# Patient Record
Sex: Male | Born: 1945 | Race: Black or African American | Hispanic: No | Marital: Married | Smoking: Never smoker
Health system: Southern US, Community
[De-identification: ages and names within clinical notes are randomized; demographics above are authoritative.]

## PROBLEM LIST (undated history)

## (undated) DIAGNOSIS — E119 Type 2 diabetes mellitus without complications: Secondary | ICD-10-CM

## (undated) DIAGNOSIS — I10 Essential (primary) hypertension: Secondary | ICD-10-CM

## (undated) DIAGNOSIS — E7800 Pure hypercholesterolemia, unspecified: Secondary | ICD-10-CM

## (undated) HISTORY — PX: ANKLE SURGERY: SHX546

## (undated) HISTORY — PX: NOSE SURGERY: SHX723

## (undated) HISTORY — PX: ELBOW SURGERY: SHX618

## (undated) HISTORY — PX: HERNIA REPAIR: SHX51

## (undated) HISTORY — PX: TONSILLECTOMY: SUR1361

## (undated) HISTORY — PX: HAND SURGERY: SHX662

---

## 2003-03-16 ENCOUNTER — Other Ambulatory Visit: Payer: Self-pay

## 2004-09-24 ENCOUNTER — Emergency Department: Payer: Self-pay | Admitting: Internal Medicine

## 2004-09-24 ENCOUNTER — Other Ambulatory Visit: Payer: Self-pay

## 2005-05-07 ENCOUNTER — Emergency Department: Payer: Self-pay | Admitting: Emergency Medicine

## 2006-03-11 ENCOUNTER — Emergency Department: Payer: Self-pay | Admitting: Internal Medicine

## 2010-10-04 ENCOUNTER — Emergency Department: Payer: Self-pay | Admitting: Emergency Medicine

## 2010-10-06 DIAGNOSIS — E113299 Type 2 diabetes mellitus with mild nonproliferative diabetic retinopathy without macular edema, unspecified eye: Secondary | ICD-10-CM | POA: Insufficient documentation

## 2010-10-06 DIAGNOSIS — E119 Type 2 diabetes mellitus without complications: Secondary | ICD-10-CM | POA: Insufficient documentation

## 2017-10-05 DIAGNOSIS — M1710 Unilateral primary osteoarthritis, unspecified knee: Secondary | ICD-10-CM | POA: Insufficient documentation

## 2017-10-05 DIAGNOSIS — M179 Osteoarthritis of knee, unspecified: Secondary | ICD-10-CM | POA: Insufficient documentation

## 2018-05-04 ENCOUNTER — Emergency Department
Admission: EM | Admit: 2018-05-04 | Discharge: 2018-05-04 | Disposition: A | Discharge status: Home / Self Care | Payer: Medicare Other | Attending: Student in an Organized Health Care Education/Training Program | Admitting: Student in an Organized Health Care Education/Training Program

## 2018-05-04 ENCOUNTER — Other Ambulatory Visit: Payer: Self-pay

## 2018-05-04 ENCOUNTER — Encounter: Payer: Self-pay | Admitting: Emergency Medicine

## 2018-05-04 ENCOUNTER — Emergency Department: Payer: Medicare Other

## 2018-05-04 DIAGNOSIS — R0602 Shortness of breath: Secondary | ICD-10-CM | POA: Insufficient documentation

## 2018-05-04 DIAGNOSIS — I10 Essential (primary) hypertension: Secondary | ICD-10-CM | POA: Diagnosis not present

## 2018-05-04 DIAGNOSIS — E119 Type 2 diabetes mellitus without complications: Secondary | ICD-10-CM | POA: Diagnosis not present

## 2018-05-04 DIAGNOSIS — J40 Bronchitis, not specified as acute or chronic: Secondary | ICD-10-CM | POA: Diagnosis not present

## 2018-05-04 DIAGNOSIS — R0789 Other chest pain: Secondary | ICD-10-CM | POA: Diagnosis present

## 2018-05-04 HISTORY — DX: Type 2 diabetes mellitus without complications: E11.9

## 2018-05-04 HISTORY — DX: Pure hypercholesterolemia, unspecified: E78.00

## 2018-05-04 HISTORY — DX: Essential (primary) hypertension: I10

## 2018-05-04 LAB — CBC
HEMATOCRIT: 44 % (ref 39.0–52.0)
Hemoglobin: 15 g/dL (ref 13.0–17.0)
MCH: 29.8 pg (ref 26.0–34.0)
MCHC: 34.1 g/dL (ref 30.0–36.0)
MCV: 87.5 fL (ref 80.0–100.0)
Platelets: 225 10*3/uL (ref 150–400)
RBC: 5.03 MIL/uL (ref 4.22–5.81)
RDW: 13.4 % (ref 11.5–15.5)
WBC: 4.5 10*3/uL (ref 4.0–10.5)
nRBC: 0 % (ref 0.0–0.2)

## 2018-05-04 LAB — BASIC METABOLIC PANEL
Anion gap: 11 (ref 5–15)
BUN: 16 mg/dL (ref 8–23)
CO2: 26 mmol/L (ref 22–32)
Calcium: 9.8 mg/dL (ref 8.9–10.3)
Chloride: 103 mmol/L (ref 98–111)
Creatinine, Ser: 1.09 mg/dL (ref 0.61–1.24)
GFR calc Af Amer: >60 mL/min (ref >60)
GFR calc non Af Amer: >60 mL/min (ref >60)
Glucose, Bld: 132 mg/dL — ABNORMAL HIGH (ref 70–99)
Potassium: 3.3 mmol/L — ABNORMAL LOW (ref 3.5–5.1)
Sodium: 140 mmol/L (ref 135–145)

## 2018-05-04 LAB — TROPONIN I
Troponin I: <0.03 ng/mL (ref <0.03)
Troponin I: <0.03 ng/mL (ref <0.03)

## 2018-05-04 MED ORDER — ALBUTEROL SULFATE HFA 108 (90 BASE) MCG/ACT IN AERS: 2.0000 | INHALATION_SPRAY | Freq: Four times a day (QID) | RESPIRATORY_TRACT | 2 refills | Status: AC | PRN

## 2018-05-04 MED ORDER — IPRATROPIUM-ALBUTEROL 0.5-2.5 (3) MG/3ML IN SOLN
3.0000 mL | Freq: Once | RESPIRATORY_TRACT | Status: AC
  Administered 2018-05-04: 3 mL via RESPIRATORY_TRACT
  Filled 2018-05-04: qty 3

## 2018-05-04 NOTE — ED Triage Notes (Signed)
Patient reports chest tightness since Monday.  Patient states he has had this same feeling before and was given an inhaler and felt better.  Patient denies cough and shortness of breath.  Patient is in no obvious distress at this time.  Patient denies cardiac history.

## 2018-05-04 NOTE — Discharge Instructions (Addendum)
You have a viral illness which can have symptoms like muscle aches, fevers, chills, runny nose, cough, sneezing, sore throat, vomiting or diarrhea. One of the viruses that can cause this is SARS- CoV-2, the virus that causes COVID-19, also known as the novel coronavirus. You could also have a different viral infection such as the common cold or flu. Most patients with viral illness including COVID-19 have mild symptoms and recover on their own. Resting, staying hydrated, and sleeping are helpful. Today we think you are well enough to go home and treat your symptoms with oral liquids, and medicine for fevers, cough, and pain. ° °We generally do not do COVID-19 testing on people with mild symptoms who are being discharged from the Emergency Department or Clinic.  ° °If we did a test for COVID-19 the results will not be available for several days. We will call you with the result. Please DO NOT CONTACT THE EMERGENCY DEPARTMENT OR CLINIC FOR RESULTS OF THIS TEST.  ° °Please follow the precautions below:  °Stay home for at least 7 days after your symptoms began OR for 3 days after your fever ends, whichever takes longer. ? ° °If people live with you they should also stay home and avoid contact with others for 14 days.? ° ° °ISOLATION GUIDANCE FOR POSSIBLE COVID °Most people with cough and fever have an illness caused by a virus. One is COVID-19. Not all people with these infections are being tested for the virus that causes COVID-19. People who might have COVID but are not being tested should still try to prevent the spread of the infection. ° °These instructions are modified recommendations from the [place] Department of Health and Human Services. ° °People who might have COVID-19 should follow the instructions below until a doctor or health department says they can stop. ° °Stay home unless you need to see a doctor °Stop doing things outside your home except for getting medical care. Do not go to work, school,  or public areas; and do not use public transportation or taxis. ° °Call ahead before visiting the doctor °Before your appointment, call the doctor's office and tell them about your symptoms. This will help them take steps to keep other people from getting infected.  ° °Keep track of your symptoms °Symptoms are the things you feel, like fever or trouble breathing. Go to your doctor or the ER if you think you are getting worse, like having more trouble breathing. Call the doctor's office and tell them about your symptoms. This will help them take steps to keep other people from getting sick.  ° °Wear a face mask °You should wear a face mask that covers your nose and mouth when you are in the same room with other people and when you visit the doctor's office. People who live with you or visit you should also wear a face mask when they are in the same room with you. ° °Separate yourself from other people in your home °You should stay in a different room from other people in your home. You should stay separate from your family members as much as possible. You should use a separate bathroom if you can. ° °Avoid sharing things in your house °Don't share dishes, drinking glasses, cups, eating utensils, towels, bedding, or other things with people in your home. After using these things please wash them really well with soap and water. ° °Cover your coughs and sneezes °Cover your mouth and nose with a tissue when you   cough or sneeze, or you can cough or sneeze into your sleeve. Throw used tissues in a trash can that has a bag in it, and immediately wash your hands with soap and water for at least 20 seconds. If you use an alcohol-based hand rub please rub your hands together for 20 seconds. ° °Wash your hands °Wash your hands often and very well with soap and water for at least 20 seconds. If your hands are not visibly dirty you can use an alcohol-based hand sanitizer. Don't touch your eyes, nose, or mouth with unwashed  hands. ° ° ° °Instructions for People Helping Care for Patients with Possible COVID-19 °Follow your doctor's instructions °Make sure that you understand and can help the patient follow any instructions for care. ° °Provide for the patient's basic needs °You should help the patient with basic needs in the home and provide support for getting groceries, prescriptions, and other personal needs. ° °Keep track of the patient's symptoms °If they are getting sicker, call his or her doctor. This will help the doctor's office take steps to keep other people from getting infected. ° °Limit the number of people who have contact with the patient °If possible, have only one caregiver for the patient. °Other family members should stay in another home or place of residence. If they can't, they should stay in another room and stay separated from the patient as much as possible. °Keep one bathroom JUST for the patient if you can.  °Only allow visitors if they MUST be in the home. ° °Keep older adults, very young children, and other sick people away from the patient °Keep older adults, very young children, and people who have compromised immune systems or chronic health conditions away from the patient. This includes people with chronic heart, lung, or kidney conditions, diabetes, and cancer. ° °Wash your hands often °Avoid touching your eyes, nose, and mouth with unwashed hands. °Wash your hands often and thoroughly with soap and water for at least 20 seconds. You can use an alcohol-based hand sanitizer if soap and water are not available and if your hands are not visibly dirty.  °Use disposable paper towels to dry your hands. If not available, use dedicated cloth towels and replace them when they become wet. ° °Avoid contamination from face masks and gloves °Wear a disposable face mask and gloves whenever you are touching the patient, things in their room or bathroom, or things that can have their body fluid on them, like bedding  or dishes, or blood, vomit, urine, or feces (poop).  °Ensure the mask fits over your nose and mouth tightly, and do not touch it at all while you are wearing it. °Throw out disposable facemasks and gloves after using them. Do not reuse. °Wash your hands immediately after removing your facemask and gloves. °If your personal clothing becomes dirty with a patient's body fluids, carefully remove clothing and launder. Wash your hands after handling dirty clothing. °Place all used disposable facemasks, gloves, and other waste in a lined container before disposing them with other household garbage.  °Remove gloves and wash your hands immediately after handling these items. ° °Do not share dishes, glasses, or other household items with the patient °Avoid sharing household items. You should not share dishes, drinking glasses, cups, eating utensils, towels, bedding, or other items with a patient who is confirmed to have, or being evaluated for, COVID-19 infection.  °After the person uses these items, you should wash them very well with soap and   water. ° °Wash laundry thoroughly °Immediately remove and wash clothes or bedding that have blood, body fluids, and/or secretions or excretions, such as sweat, saliva, sputum, nasal mucus, vomit, urine, or feces, on them.  °Wear gloves when handling laundry from the patient.  °Read and follow directions on labels of laundry or clothing items and detergent. In general, wash and dry with the warmest temperatures recommended on the label. ° °Clean all areas the individual has used  °Clean all touchable surfaces, such as counters, tabletops, doorknobs, bathroom fixtures, toilets, phones, keyboards, tablets, and bedside tables, every day. Also, clean any surfaces that may have blood, body fluids, and/or secretions or excretions on them. Wear gloves when cleaning surfaces the patient has come in contact with.  °Use a diluted bleach solution (dilute bleach with 1 part bleach and 10 parts  water) or a household disinfectant with a label that says EPA-registered for coronaviruses. To make a bleach solution at home, add 1 tablespoon of bleach to 1 quart (4 cups) of water. For a larger supply, add ¼ cup of bleach to 1 gallon (16 cups) of water.  °Read labels of cleaning products and follow recommendations provided on product labels. Labels contain instructions for safe and effective use of the cleaning product including precautions you should take when applying the product, such as wearing gloves or eye protection and making sure you have good ventilation during use of the product.  °Remove gloves and wash hands immediately after cleaning. ° °Monitor yourself for signs and symptoms of illness °Caregivers and household members are considered close contacts, should monitor their health, and will be asked to limit movement outside of the home as much as possible.  ° °If you have additional questions °Contact  °Your Doctor or Healthcare Provider °The [facility] website (http://www.[facility].edu/coronavirus) °The [facility] COVID hotline 336-70COVID °Your local health department  ° °This guidance is subject to change. For the most up to date guidance, check the state Department of Health website at [facility]CD[facility].GOV ° °

## 2018-05-04 NOTE — ED Notes (Signed)
AAOx3.  Skin warm and dry.  NAD 

## 2018-05-04 NOTE — ED Provider Notes (Signed)
[facility] Emergency Department Provider Note    First MD Initiated Contact with Patient 05/04/18 1522     (approximate)  I have reviewed the triage vital signs and the nursing notes.   HISTORY  Chief Complaint Chest Pain    HPI Aaron Reynolds is a 73 y.o. male presents the ER for roughly 1 day of chest tightness.  States he actually started feeling some shortness of breath and discomfort 3 days ago.  Started having worsening chest pains today.  Does have a history of diabetes as well as high blood pressure.  Denies any history of heart disease.  States he has had inhaler treatments in the past with improvement in symptoms.  He denies any fevers at home and has been checking his temperature daily.    Past Medical History:  Diagnosis Date  . Diabetes mellitus without complication (HCC)   . High cholesterol   . Hypertension    No family history on file.  There are no active problems to display for this patient.     Prior to Admission medications   Medication Sig Start Date End Date Taking? Authorizing Provider  albuterol (PROVENTIL HFA;VENTOLIN HFA) 108 (90 Base) MCG/ACT inhaler Inhale 2 puffs into the lungs every 6 (six) hours as needed for wheezing or shortness of breath. 05/04/18   Willy Eddy, MD    Allergies Lisinopril and Celecoxib    Social History Social History   Tobacco Use  . Smoking status: Never Smoker  . Smokeless tobacco: Never Used  Substance Use Topics  . Alcohol use: Never    Frequency: Never  . Drug use: Not on file    Review of Systems Patient denies headaches, rhinorrhea, blurry vision, numbness, shortness of breath, chest pain, edema, cough, abdominal pain, nausea, vomiting, diarrhea, dysuria, fevers, rashes or hallucinations unless otherwise stated above in HPI. ____________________________________________   PHYSICAL EXAM:  VITAL SIGNS: Vitals:   05/04/18 1600 05/04/18 1601  BP: 124/73 124/73   Pulse:  81  Resp: 18 14  Temp:  97.6 F (36.4 C)  SpO2:  100%    Constitutional: Alert and oriented.  Eyes: Conjunctivae are normal.  Head: Atraumatic. Nose: No congestion/rhinnorhea. Mouth/Throat: Mucous membranes are moist.   Neck: No stridor. Painless ROM.  Cardiovascular: Normal rate, regular rhythm. Grossly normal heart sounds.  Good peripheral circulation. Respiratory: Normal respiratory effort.  No retractions. Lungs CTAB. Gastrointestinal: Soft and nontender. No distention. No abdominal bruits. No CVA tenderness. Genitourinary:  Musculoskeletal: No lower extremity tenderness nor edema.  No joint effusions. Neurologic:  Normal speech and language. No gross focal neurologic deficits are appreciated. No facial droop Skin:  Skin is warm, dry and intact. No rash noted. Psychiatric: Mood and affect are normal. Speech and behavior are normal.  ____________________________________________   LABS (all labs ordered are listed, but only abnormal results are displayed)  Results for orders placed or performed during the hospital encounter of 05/04/18 (from the past 24 hour(s))  Basic metabolic panel     Status: Abnormal   Collection Time: 05/04/18  1:25 PM  Result Value Ref Range   Sodium 140 135 - 145 mmol/L   Potassium 3.3 (L) 3.5 - 5.1 mmol/L   Chloride 103 98 - 111 mmol/L   CO2 26 22 - 32 mmol/L   Glucose, Bld 132 (H) 70 - 99 mg/dL   BUN 16 8 - 23 mg/dL   Creatinine, Ser 9.19 0.61 - 1.24 mg/dL   Calcium 9.8 8.9 - 10.3  mg/dL   GFR calc non Af Amer >60 >60 mL/min   GFR calc Af Amer >60 >60 mL/min   Anion gap 11 5 - 15  CBC     Status: None   Collection Time: 05/04/18  1:25 PM  Result Value Ref Range   WBC 4.5 4.0 - 10.5 K/uL   RBC 5.03 4.22 - 5.81 MIL/uL   Hemoglobin 15.0 13.0 - 17.0 g/dL   HCT 09.6 28.3 - 66.2 %   MCV 87.5 80.0 - 100.0 fL   MCH 29.8 26.0 - 34.0 pg   MCHC 34.1 30.0 - 36.0 g/dL   RDW 94.7 65.4 - 65.0 %   Platelets 225 150 - 400 K/uL   nRBC 0.0 0.0  - 0.2 %  Troponin I - ONCE - STAT     Status: None   Collection Time: 05/04/18  1:25 PM  Result Value Ref Range   Troponin I <0.03 <0.03 ng/mL  Troponin I - Once-Timed     Status: None   Collection Time: 05/04/18  4:12 PM  Result Value Ref Range   Troponin I <0.03 <0.03 ng/mL   ____________________________________________  EKG My review and personal interpretation at Time: 13:11   Indication: sob  Rate: 70  Rhythm: sijnus Axis: normal Other: nrmal intervals, no stemi ____________________________________________  RADIOLOGY  I personally reviewed all radiographic images ordered to evaluate for the above acute complaints and reviewed radiology reports and findings.  These findings were personally discussed with the patient.  Please see medical record for radiology report.  ____________________________________________   PROCEDURES  Procedure(s) performed:  Procedures    Critical Care performed: no ____________________________________________   INITIAL IMPRESSION / ASSESSMENT AND PLAN / ED COURSE  Pertinent labs & imaging results that were available during my care of the patient were reviewed by me and considered in my medical decision making (see chart for details).   DDX: Asthma, copd, CHF, pna, ptx, malignancy, Pe, anemia   Aaron Reynolds is a 73 y.o. who presents to the ED with symptoms as described above.  He is nontoxic afebrile without any signs of infectious process.  Low suspicion for PE.  Doubt ACS but will order serial enzymes to further re-stratify given his age.  Patient states that he feels better after nebulizer treatment therefore will give him 1 of those.  No known sick contacts or covert exposures.  Chest x-ray shows no infiltrates.  Clinical Course as of May 03 1653  Thu May 04, 2018  1648 P troponin is negative.  Patient feels improved after nebulizer treatment.  Does not meeting criteria for code for testing.  No infiltrates.  I do believe he  stable and appropriate for outpatient follow-up.   [PR]    Clinical Course User Index [PR] Willy Eddy, MD   Rozell Searing patient was evaluated in Emergency Department on 05/04/18  for the symptoms described in the history of present illness. He/she was evaluated in the context of the global COVID-19 pandemic, which necessitated consideration that the patient might be at risk for infection with the SARS-CoV-2 virus that causes COVID-19. Institutional protocols and algorithms that pertain to the evaluation of patients at risk for COVID-19 are in a state of rapid change based on information released by regulatory bodies including the CDC and federal and state organizations. These policies and algorithms were followed during the patient's care in the ED.   As part of my medical decision making, I reviewed the following data within the electronic  MEDICAL RECORD NUMBER Nursing notes reviewed and incorporated, Labs reviewed, notes from prior ED visits and [place] Controlled Substance Database   ____________________________________________   FINAL CLINICAL IMPRESSION(S) / ED DIAGNOSES  Final diagnoses:  Bronchitis      NEW MEDICATIONS STARTED DURING THIS VISIT:  New Prescriptions   ALBUTEROL (PROVENTIL HFA;VENTOLIN HFA) 108 (90 BASE) MCG/ACT INHALER    Inhale 2 puffs into the lungs every 6 (six) hours as needed for wheezing or shortness of breath.     Note:  This document was prepared using Dragon voice recognition software and may include unintentional dictation errors.    Willy Eddy, MD 05/04/18 1655

## 2018-06-06 DIAGNOSIS — M79606 Pain in leg, unspecified: Secondary | ICD-10-CM | POA: Insufficient documentation

## 2018-06-06 DIAGNOSIS — M25569 Pain in unspecified knee: Secondary | ICD-10-CM

## 2018-06-06 DIAGNOSIS — M20019 Mallet finger of unspecified finger(s): Secondary | ICD-10-CM | POA: Insufficient documentation

## 2018-06-07 ENCOUNTER — Ambulatory Visit (INDEPENDENT_AMBULATORY_CARE_PROVIDER_SITE_OTHER): Payer: Medicare Other | Admitting: Podiatry

## 2018-06-07 ENCOUNTER — Ambulatory Visit (INDEPENDENT_AMBULATORY_CARE_PROVIDER_SITE_OTHER): Payer: Medicare Other

## 2018-06-07 ENCOUNTER — Other Ambulatory Visit: Payer: Self-pay

## 2018-06-07 ENCOUNTER — Encounter: Payer: Self-pay | Admitting: Podiatry

## 2018-06-07 VITALS — BP 119/57 | HR 73 | Temp 97.7°F | Resp 16

## 2018-06-07 DIAGNOSIS — M7750 Other enthesopathy of unspecified foot: Secondary | ICD-10-CM

## 2018-06-07 DIAGNOSIS — I872 Venous insufficiency (chronic) (peripheral): Secondary | ICD-10-CM

## 2018-06-07 DIAGNOSIS — M7752 Other enthesopathy of left foot: Secondary | ICD-10-CM

## 2018-06-07 DIAGNOSIS — E1142 Type 2 diabetes mellitus with diabetic polyneuropathy: Secondary | ICD-10-CM | POA: Diagnosis not present

## 2018-06-07 DIAGNOSIS — M7751 Other enthesopathy of right foot: Secondary | ICD-10-CM

## 2018-06-07 MED ORDER — GABAPENTIN 300 MG PO CAPS: ORAL_CAPSULE | ORAL | 3 refills | Status: DC

## 2018-06-07 NOTE — Progress Notes (Signed)
Subjective:  Patient ID: Aaron Reynolds, male    DOB: 1946-01-29,  MRN: [id number] HPI Chief Complaint  Patient presents with  . Foot Pain    Forefoot bilateral (L>R) - aching x couple months, swelling, no injury, no treatment  . New Patient (Initial Visit)    73 y.o. male presents with the above complaint.   ROS: Denies fever chills nausea vomiting muscle aches pains calf pain back pain chest pain shortness of breath.  Past Medical History:  Diagnosis Date  . Diabetes mellitus without complication (HCC)   . High cholesterol   . Hypertension      Current Outpatient Medications:  .  amLODipine (NORVASC) 5 MG tablet, Take by mouth., Disp: , Rfl:  .  cyclobenzaprine (FLEXERIL) 10 MG tablet, Take by mouth., Disp: , Rfl:  .  etodolac (LODINE XL) 400 MG 24 hr tablet, Take by mouth., Disp: , Rfl:  .  gemfibrozil (LOPID) 600 MG tablet, Take by mouth., Disp: , Rfl:  .  guaifenesin (ROBITUSSIN) 100 MG/5ML syrup, Take by mouth., Disp: , Rfl:  .  hydrochlorothiazide (HYDRODIURIL) 50 MG tablet, Take by mouth., Disp: , Rfl:  .  hydrocortisone 2.5 % cream, Frequency:BID   Dosage:0.0     Instructions:  Note:Dose: 2.5 %, Disp: , Rfl:  .  insulin detemir (LEVEMIR) 100 UNIT/ML injection, Frequency:QHS   Dosage:5   UNITS  Instructions:  Note:Dose: 5 UNITS, Disp: , Rfl:  .  ketoconazole (NIZORAL) 2 % cream, Frequency:BID   Dosage:0.0     Instructions:  Note:Dose: 2 %, Disp: , Rfl:  .  nabumetone (RELAFEN) 500 MG tablet, Take by mouth., Disp: , Rfl:  .  naproxen (NAPROSYN) 500 MG tablet, Take by mouth., Disp: , Rfl:  .  orphenadrine (NORFLEX) 100 MG tablet, Take by mouth., Disp: , Rfl:  .  pravastatin (PRAVACHOL) 20 MG tablet, Take by mouth., Disp: , Rfl:  .  sitaGLIPtin (JANUVIA) 50 MG tablet, Januvia 50 mg tablet  TK 1 T PO BID, Disp: , Rfl:  .  Skin Protectants, Misc. (DERMACERIN) CREA, Frequency:BID   Dosage:0.0     Instructions:  Note:Dose: 227 GM, Disp: , Rfl:  .  traZODone (DESYREL)  50 MG tablet, trazodone 50 mg tablet, Disp: , Rfl:  .  triamcinolone cream (KENALOG) 0.1 %, Frequency:BID   Dosage:0.0     Instructions:  Note:Dose: 0.1 %, Disp: , Rfl:  .  albuterol (PROVENTIL HFA;VENTOLIN HFA) 108 (90 Base) MCG/ACT inhaler, Inhale 2 puffs into the lungs every 6 (six) hours as needed for wheezing or shortness of breath., Disp: 1 Inhaler, Rfl: 2 .  Alogliptin Benzoate 25 MG TABS, , Disp: , Rfl:  .  amLODipine (NORVASC) 10 MG tablet, , Disp: , Rfl:  .  aspirin EC 325 MG tablet, , Disp: , Rfl:  .  atenolol (TENORMIN) 50 MG tablet, , Disp: , Rfl:  .  Calcium Carbonate-Vitamin D 600-400 MG-UNIT tablet, , Disp: , Rfl:  .  chlorthalidone (HYGROTON) 25 MG tablet, , Disp: , Rfl:  .  Diclofenac Sodium (PENNSAID) 2 % SOLN, Pennsaid 20 mg/gram/actuation (2 %) topical soln in metered-dose pump  APPLY 2 PUMPS (2 GRAMS) TO AFFECTED AREA TOPICALLY TWICE DAILY AS DIRECTED, Disp: , Rfl:  .  gabapentin (NEURONTIN) 300 MG capsule, Take capsule in AM and one capsule QHS, Disp: 60 capsule, Rfl: 3 .  hydrOXYzine (VISTARIL) 25 MG capsule, hydroxyzine pamoate 25 mg capsule, Disp: , Rfl:  .  Ibuprofen-Famotidine (DUEXIS) 800-26.6 MG TABS, Duexis 800  mg-26.6 mg tablet  TAKE ONE TABLET BY MOUTH THREE TIMES DAILY, Disp: , Rfl:  .  LUBRICATING PLUS EYE DROPS 0.5 % SOLN, , Disp: , Rfl:  .  meloxicam (MOBIC) 15 MG tablet, meloxicam 15 mg tablet, Disp: , Rfl:  .  metFORMIN (GLUCOPHAGE) 1000 MG tablet, , Disp: , Rfl:  .  NOVOLOG FLEXPEN 100 UNIT/ML FlexPen, , Disp: , Rfl:  .  potassium chloride SA (K-DUR) 20 MEQ tablet, , Disp: , Rfl:  .  pravastatin (PRAVACHOL) 80 MG tablet, pravastatin 80 mg tablet, Disp: , Rfl:  .  prednisoLONE acetate (PRED FORTE) 1 % ophthalmic suspension, prednisolone acetate 1 % eye drops,suspension, Disp: , Rfl:  .  sildenafil (VIAGRA) 50 MG tablet, , Disp: , Rfl:  .  terazosin (HYTRIN) 5 MG capsule, terazosin 5 mg capsule, Disp: , Rfl:  .  tobramycin (TOBREX) 0.3 % ophthalmic  solution, INT 1 GTT IN OU Q 3 H FOR 7 DAYS, Disp: , Rfl:   Allergies  Allergen Reactions  . Lisinopril Anaphylaxis  . Celecoxib Other (See Comments)    Causes pt's blood sugar to rise Causes pt's blood sugar to rise    Review of Systems Objective:   Vitals:   06/07/18 1341  BP: (!) 119/57  Pulse: 73  Resp: 16  Temp: 97.7 F (36.5 C)    General: Well developed, nourished, in no acute distress, alert and oriented x3   Dermatological: Skin is warm, dry and supple bilateral. Nails x 10 are well maintained; remaining integument appears unremarkable at this time. There are no open sores, no preulcerative lesions, no rash or signs of infection present.  Vascular: Dorsalis Pedis artery and Posterior Tibial artery pedal pulses are 2/4 bilateral with immedate capillary fill time. Pedal hair growth present. No varicosities and no lower extremity edema present bilateral.  Edema bilateral lower extremity pitting in nature particular dorsum of the foot  Neruologic: Grossly intact via light touch bilateral. Vibratory intact via tuning fork bilateral. Protective threshold with Semmes Wienstein monofilament diminished to all pedal sites bilateral. Patellar and Achilles deep tendon reflexes 2+ bilateral. No Babinski or clonus noted bilateral.  No reproducible pain on palpation  Musculoskeletal: No gross boney pedal deformities bilateral. No pain, crepitus, or limitation noted with foot and ankle range of motion bilateral. Muscular strength 5/5 in all groups tested bilateral.  Gait: Unassisted, Nonantalgic.    Radiographs:  Radiographs taken today demonstrate hallux abductovalgus deformity with pes planus bone spur and ossicle medial aspect of the hypertrophic medial condyle of the first metatarsal bilaterally.  No other major osseous abnormalities soft tissue edema is noted forefoot.  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 [place], [place]

## 2018-07-12 ENCOUNTER — Encounter: Payer: Self-pay | Admitting: Podiatry

## 2018-07-12 ENCOUNTER — Other Ambulatory Visit: Payer: Self-pay

## 2018-07-12 ENCOUNTER — Ambulatory Visit (INDEPENDENT_AMBULATORY_CARE_PROVIDER_SITE_OTHER): Payer: No Typology Code available for payment source | Admitting: Podiatry

## 2018-07-12 VITALS — Temp 97.2°F

## 2018-07-12 DIAGNOSIS — I872 Venous insufficiency (chronic) (peripheral): Secondary | ICD-10-CM | POA: Diagnosis not present

## 2018-07-12 DIAGNOSIS — E1142 Type 2 diabetes mellitus with diabetic polyneuropathy: Secondary | ICD-10-CM

## 2018-07-12 NOTE — Progress Notes (Signed)
He presents today for follow-up of his diabetic peripheral neuropathy the last time he is in we started him on medication to help alleviate his symptoms.  We were also going to have him in for vascular evaluation.  He states that he was never called for the vascular evaluation.  He does state that his feet are 100% improved from where they were he is very happy with the outcome of this medication.  Objective: Vital signs are stable he is alert and oriented x3.  No change in vascular evaluation no change in the skin.  No open lesions or wounds that he does have much decrease in edema secondary to compression socks that he had bought himself.  Assessment: Resolving diabetic peripheral neuropathy.  Plan: Continue current medications we will follow-up with him

## 2018-08-21 ENCOUNTER — Ambulatory Visit (INDEPENDENT_AMBULATORY_CARE_PROVIDER_SITE_OTHER): Payer: Medicare Other | Admitting: Vascular Surgery

## 2018-08-21 ENCOUNTER — Ambulatory Visit (INDEPENDENT_AMBULATORY_CARE_PROVIDER_SITE_OTHER): Payer: Medicare Other

## 2018-08-21 ENCOUNTER — Encounter (INDEPENDENT_AMBULATORY_CARE_PROVIDER_SITE_OTHER): Payer: Self-pay | Admitting: Vascular Surgery

## 2018-08-21 ENCOUNTER — Other Ambulatory Visit: Payer: Self-pay

## 2018-08-21 VITALS — BP 156/71 | HR 56 | Resp 18 | Ht 68.0 in | Wt 166.0 lb

## 2018-08-21 DIAGNOSIS — M79604 Pain in right leg: Secondary | ICD-10-CM

## 2018-08-21 DIAGNOSIS — I872 Venous insufficiency (chronic) (peripheral): Secondary | ICD-10-CM

## 2018-08-21 DIAGNOSIS — E119 Type 2 diabetes mellitus without complications: Secondary | ICD-10-CM | POA: Diagnosis not present

## 2018-08-21 DIAGNOSIS — M1710 Unilateral primary osteoarthritis, unspecified knee: Secondary | ICD-10-CM

## 2018-08-21 DIAGNOSIS — E1142 Type 2 diabetes mellitus with diabetic polyneuropathy: Secondary | ICD-10-CM

## 2018-08-21 DIAGNOSIS — M79605 Pain in left leg: Secondary | ICD-10-CM

## 2018-08-21 DIAGNOSIS — Z791 Long term (current) use of non-steroidal anti-inflammatories (NSAID): Secondary | ICD-10-CM

## 2018-08-21 DIAGNOSIS — Z794 Long term (current) use of insulin: Secondary | ICD-10-CM

## 2018-08-21 NOTE — Progress Notes (Signed)
MRN : [id number]  Aaron Reynolds is a 73 y.o. (Nov 22, 1945) male who presents with chief complaint of  Chief Complaint  Patient presents with  . Follow-up  .  History of Present Illness:   Patient is seen for evaluation of leg pain and leg swelling. The patient first noticed the swelling remotely. The swelling is associated with pain and discoloration. The pain and swelling worsens with prolonged dependency and improves with elevation. The pain is unrelated to activity.  The patient notes that in the morning the legs are significantly improved but they steadily worsened throughout the course of the day. The patient also notes a steady worsening of the discoloration in the ankle and shin area.   The patient denies claudication symptoms.  The patient denies symptoms consistent with rest pain.  The patient denies and extensive history of DJD and LS spine disease.  The patient has no had any past angiography, interventions or vascular surgery.  Elevation makes the leg symptoms better, dependency makes them much worse. There is no history of ulcerations. The patient denies any recent changes in medications.  The patient has not been wearing graduated compression.  The patient denies a history of DVT or PE. There is no prior history of phlebitis. There is no history of primary lymphedema.  No history of malignancies. No history of trauma or groin or pelvic surgery. There is no history of radiation treatment to the groin or pelvis  The patient denies amaurosis fugax or recent TIA symptoms. There are no recent neurological changes noted. The patient denies recent episodes of angina or shortness of breath  Venous duplex of the legs shows a normal venous system, no superficial reflux noted.  ABI's Rt=1.22 and Lt=1.22 (triphasic signals bilaterally)  Current Meds  Medication Sig  . albuterol (PROVENTIL HFA;VENTOLIN HFA) 108 (90 Base) MCG/ACT inhaler Inhale 2 puffs into the lungs  every 6 (six) hours as needed for wheezing or shortness of breath.  . Alogliptin Benzoate 25 MG TABS   . amLODipine (NORVASC) 10 MG tablet   . amLODipine (NORVASC) 5 MG tablet Take by mouth.  Marland Kitchen. aspirin EC 325 MG tablet   . atenolol (TENORMIN) 50 MG tablet   . Calcium Carbonate-Vitamin D 600-400 MG-UNIT tablet   . chlorthalidone (HYGROTON) 25 MG tablet   . cyclobenzaprine (FLEXERIL) 10 MG tablet Take by mouth.  . Diclofenac Sodium (PENNSAID) 2 % SOLN Pennsaid 20 mg/gram/actuation (2 %) topical soln in metered-dose pump  APPLY 2 PUMPS (2 GRAMS) TO AFFECTED AREA TOPICALLY TWICE DAILY AS DIRECTED  . etodolac (LODINE XL) 400 MG 24 hr tablet Take by mouth.  . gabapentin (NEURONTIN) 300 MG capsule Take capsule in AM and one capsule QHS  . gemfibrozil (LOPID) 600 MG tablet Take by mouth.  . guaifenesin (ROBITUSSIN) 100 MG/5ML syrup Take by mouth.  . hydrochlorothiazide (HYDRODIURIL) 50 MG tablet Take by mouth.  . hydrocortisone 2.5 % cream Frequency:BID   Dosage:0.0     Instructions:  Note:Dose: 2.5 %  . hydrOXYzine (VISTARIL) 25 MG capsule hydroxyzine pamoate 25 mg capsule  . Ibuprofen-Famotidine (DUEXIS) 800-26.6 MG TABS Duexis 800 mg-26.6 mg tablet  TAKE ONE TABLET BY MOUTH THREE TIMES DAILY  . insulin detemir (LEVEMIR) 100 UNIT/ML injection Frequency:QHS   Dosage:5   UNITS  Instructions:  Note:Dose: 5 UNITS  . ketoconazole (NIZORAL) 2 % cream Frequency:BID   Dosage:0.0     Instructions:  Note:Dose: 2 %  . LUBRICATING PLUS EYE DROPS 0.5 % SOLN   .  meloxicam (MOBIC) 15 MG tablet meloxicam 15 mg tablet  . metFORMIN (GLUCOPHAGE) 1000 MG tablet   . nabumetone (RELAFEN) 500 MG tablet Take by mouth.  . naproxen (NAPROSYN) 500 MG tablet Take by mouth.  Marland Kitchen. NOVOLOG FLEXPEN 100 UNIT/ML FlexPen   . orphenadrine (NORFLEX) 100 MG tablet Take by mouth.  . potassium chloride SA (K-DUR) 20 MEQ tablet   . pravastatin (PRAVACHOL) 20 MG tablet Take by mouth.  . pravastatin (PRAVACHOL) 80 MG tablet pravastatin  80 mg tablet  . prednisoLONE acetate (PRED FORTE) 1 % ophthalmic suspension prednisolone acetate 1 % eye drops,suspension  . sildenafil (VIAGRA) 50 MG tablet   . sitaGLIPtin (JANUVIA) 50 MG tablet Januvia 50 mg tablet  TK 1 T PO BID  . Skin Protectants, Misc. (DERMACERIN) CREA Frequency:BID   Dosage:0.0     Instructions:  Note:Dose: 227 GM  . terazosin (HYTRIN) 5 MG capsule terazosin 5 mg capsule  . tobramycin (TOBREX) 0.3 % ophthalmic solution INT 1 GTT IN OU Q 3 H FOR 7 DAYS  . traZODone (DESYREL) 50 MG tablet trazodone 50 mg tablet  . triamcinolone cream (KENALOG) 0.1 % Frequency:BID   Dosage:0.0     Instructions:  Note:Dose: 0.1 %    Past Medical History:  Diagnosis Date  . Diabetes mellitus without complication (HCC)   . High cholesterol   . Hypertension     Past Surgical History:  Procedure Laterality Date  . ANKLE SURGERY    . ELBOW SURGERY    . HAND SURGERY    . HERNIA REPAIR    . NOSE SURGERY    . TONSILLECTOMY      Social History Social History   Tobacco Use  . Smoking status: Never Smoker  . Smokeless tobacco: Never Used  Substance Use Topics  . Alcohol use: Never    Frequency: Never  . Drug use: Not on file    Family History History reviewed. No pertinent family history.  No family history of bleeding/clotting disorders, porphyria or autoimmune disease   Allergies  Allergen Reactions  . Lisinopril Anaphylaxis  . Celecoxib Other (See Comments)    Causes pt's blood sugar to rise Causes pt's blood sugar to rise      REVIEW OF SYSTEMS (Negative unless checked)  Constitutional: [] Weight loss  [] Fever  [] Chills Cardiac: [] Chest pain   [] Chest pressure   [] Palpitations   [] Shortness of breath when laying flat   [] Shortness of breath with exertion. Vascular:  [] Pain in legs with walking   [x] Pain in legs at rest  [] History of DVT   [] Phlebitis   [x] Swelling in legs   [] Varicose veins   [] Non-healing ulcers Pulmonary:   [] Uses home oxygen   [] Productive  cough   [] Hemoptysis   [] Wheeze  [] COPD   [] Asthma Neurologic:  [] Dizziness   [] Seizures   [] History of stroke   [] History of TIA  [] Aphasia   [] Vissual changes   [] Weakness or numbness in arm   [] Weakness or numbness in leg Musculoskeletal:   [] Joint swelling   [] Joint pain   [] Low back pain Hematologic:  [] Easy bruising  [] Easy bleeding   [] Hypercoagulable state   [] Anemic Gastrointestinal:  [] Diarrhea   [] Vomiting  [x] Gastroesophageal reflux/heartburn   [] Difficulty swallowing. Genitourinary:  [] Chronic kidney disease   [] Difficult urination  [] Frequent urination   [] Blood in urine Skin:  [] Rashes   [] Ulcers  Psychological:  [] History of anxiety   []  History of major depression.  Physical Examination  Vitals:   08/21/18 0900  BP: (!) 156/71  Pulse: (!) 56  Resp: 18  Weight: 166 lb (75.3 kg)  Height: 5\' 8"  (1.727 m)   Body mass index is 25.24 kg/m. Gen: WD/WN, NAD Head: [place]/AT, No temporalis wasting.  Ear/Nose/Throat: Hearing grossly intact, nares w/o erythema or drainage, poor dentition Eyes: PER, EOMI, sclera nonicteric.  Neck: Supple, no masses.  No bruit or JVD.  Pulmonary:  Good air movement, clear to auscultation bilaterally, no use of accessory muscles.  Cardiac: RRR, normal S1, S2, no Murmurs. Vascular: scattered varicosities present bilaterally.  Moderate venous stasis changes to the legs bilaterally.  2-3+ soft pitting edema Vessel Right Left  Radial Palpable Palpable  PT Palpable Palpable  DP Palpable Palpable  Gastrointestinal: soft, non-distended. No guarding/no peritoneal signs.  Musculoskeletal: M/S 5/5 throughout.  No deformity or atrophy.  Neurologic: CN 2-12 intact. Pain and light touch intact in extremities.  Symmetrical.  Speech is fluent. Motor exam as listed above. Psychiatric: Judgment intact, Mood & affect appropriate for pt's clinical situation. Dermatologic: Venous rashes no ulcers noted.  No changes consistent with cellulitis.  Lymph : No Cervical  lymphadenopathy, no lichenification or skin changes of chronic lymphedema.  CBC Lab Results  Component Value Date   WBC 4.5 05/04/2018   HGB 15.0 05/04/2018   HCT 44.0 05/04/2018   MCV 87.5 05/04/2018   PLT 225 05/04/2018    BMET    Component Value Date/Time   NA 140 05/04/2018 1325   K 3.3 (L) 05/04/2018 1325   CL 103 05/04/2018 1325   CO2 26 05/04/2018 1325   GLUCOSE 132 (H) 05/04/2018 1325   BUN 16 05/04/2018 1325   CREATININE 1.09 05/04/2018 1325   CALCIUM 9.8 05/04/2018 1325   GFRNONAA >60 05/04/2018 1325   GFRAA >60 05/04/2018 1325   CrCl cannot be calculated (Patient's most recent lab result is older than the maximum 21 days allowed.).  COAG No results found for: INR, PROTIME  Radiology No results found.   Assessment/Plan 1. Pain in both lower extremities Recommend:  The patient is complaining of symptoms consistent with venous insufficiency.    I have had a long discussion with the patient regarding  Venous insufficiency and why they cause symptoms.  Patient will begin wearing graduated compression stockings on a daily basis, beginning first thing in the morning and removing them in the evening. The patient is instructed specifically not to sleep in the stockings.    The patient  will also begin using over-the-counter analgesics such as Motrin 600 mg po TID to help control the symptoms as needed.    In addition, behavioral modification including elevation during the day will be initiated, utilizing a recliner was recommended.  The patient is also instructed to continue exercising such as walking 4-5 times per week.  At this time the patient wishes to continue conservative therapy and is not interested in more invasive treatments such as laser ablation and sclerotherapy which I concur with.  The Patient will follow up PRN if the symptoms worsen.  2. Chronic venous insufficiency See #1  3. Type 2 diabetes mellitus without complication, with long-term  current use of insulin (HCC) Continue hypoglycemic medications as already ordered, these medications have been reviewed and there are no changes at this time.  Hgb A1C to be monitored as already arranged by primary service   4. Primary osteoarthritis of knee, unspecified laterality Continue NSAID medications as already ordered, these medications have been reviewed and there are no changes at this time.  Continued activity and therapy was stressed.    Aaron Pilar, MD  08/21/2018 3:03 PM

## 2018-08-23 ENCOUNTER — Ambulatory Visit: Payer: Non-veteran care | Admitting: Podiatry

## 2018-08-23 ENCOUNTER — Ambulatory Visit (INDEPENDENT_AMBULATORY_CARE_PROVIDER_SITE_OTHER): Payer: Medicare Other | Admitting: Podiatry

## 2018-08-23 ENCOUNTER — Encounter: Payer: Self-pay | Admitting: Podiatry

## 2018-08-23 ENCOUNTER — Other Ambulatory Visit: Payer: Self-pay

## 2018-08-23 VITALS — Temp 98.2°F

## 2018-08-23 DIAGNOSIS — E1142 Type 2 diabetes mellitus with diabetic polyneuropathy: Secondary | ICD-10-CM

## 2018-08-23 DIAGNOSIS — I872 Venous insufficiency (chronic) (peripheral): Secondary | ICD-10-CM

## 2018-08-23 MED ORDER — GABAPENTIN 300 MG PO CAPS: ORAL_CAPSULE | ORAL | 3 refills | Status: AC

## 2018-08-23 NOTE — Progress Notes (Signed)
He presents today for follow-up of his edema bilateral lower extremity his itching to his legs and a polyneuropathy.  Objective: Vital signs are stable he is alert and oriented x3 no change in physical exam other than the edema has subsided considerably after visiting with the vascular doctors who had recommended the use of compression hose and diagnosed him with venous insufficiency.  Assessment: Diabetic peripheral neuropathy venous insufficiency no open lesions or wounds.  Plan: Discussed etiology pathology conservative surgical therapies at this point went ahead and started him back on his gabapentin refill that for him.  And he will continue compression hose follow-up with me as needed.

## 2019-07-17 ENCOUNTER — Other Ambulatory Visit: Payer: Self-pay

## 2019-07-17 ENCOUNTER — Emergency Department
Admission: EM | Admit: 2019-07-17 | Discharge: 2019-07-17 | Disposition: A | Discharge status: Home / Self Care | Payer: No Typology Code available for payment source | Attending: Student in an Organized Health Care Education/Training Program | Admitting: Student in an Organized Health Care Education/Training Program

## 2019-07-17 ENCOUNTER — Emergency Department: Payer: No Typology Code available for payment source

## 2019-07-17 ENCOUNTER — Encounter: Payer: Self-pay | Admitting: Emergency Medicine

## 2019-07-17 DIAGNOSIS — Z794 Long term (current) use of insulin: Secondary | ICD-10-CM | POA: Diagnosis not present

## 2019-07-17 DIAGNOSIS — E1165 Type 2 diabetes mellitus with hyperglycemia: Secondary | ICD-10-CM | POA: Insufficient documentation

## 2019-07-17 DIAGNOSIS — R0789 Other chest pain: Secondary | ICD-10-CM | POA: Diagnosis not present

## 2019-07-17 DIAGNOSIS — R0981 Nasal congestion: Secondary | ICD-10-CM | POA: Insufficient documentation

## 2019-07-17 DIAGNOSIS — I10 Essential (primary) hypertension: Secondary | ICD-10-CM | POA: Diagnosis not present

## 2019-07-17 DIAGNOSIS — R0989 Other specified symptoms and signs involving the circulatory and respiratory systems: Secondary | ICD-10-CM | POA: Insufficient documentation

## 2019-07-17 LAB — BASIC METABOLIC PANEL
Anion gap: 11 (ref 5–15)
BUN: 16 mg/dL (ref 8–23)
CO2: 28 mmol/L (ref 22–32)
Calcium: 9.7 mg/dL (ref 8.9–10.3)
Chloride: 103 mmol/L (ref 98–111)
Creatinine, Ser: 1.1 mg/dL (ref 0.61–1.24)
GFR calc Af Amer: >60 mL/min (ref >60)
GFR calc non Af Amer: >60 mL/min (ref >60)
Glucose, Bld: 148 mg/dL — ABNORMAL HIGH (ref 70–99)
Potassium: 3.3 mmol/L — ABNORMAL LOW (ref 3.5–5.1)
Sodium: 142 mmol/L (ref 135–145)

## 2019-07-17 LAB — CBC
HCT: 45.2 % (ref 39.0–52.0)
Hemoglobin: 15.4 g/dL (ref 13.0–17.0)
MCH: 30.2 pg (ref 26.0–34.0)
MCHC: 34.1 g/dL (ref 30.0–36.0)
MCV: 88.6 fL (ref 80.0–100.0)
Platelets: 235 10*3/uL (ref 150–400)
RBC: 5.1 MIL/uL (ref 4.22–5.81)
RDW: 13.6 % (ref 11.5–15.5)
WBC: 4.8 10*3/uL (ref 4.0–10.5)
nRBC: 0 % (ref 0.0–0.2)

## 2019-07-17 LAB — TROPONIN I (HIGH SENSITIVITY): Troponin I (High Sensitivity): 7 ng/L (ref <18)

## 2019-07-17 MED ORDER — AZITHROMYCIN 250 MG PO TABS
ORAL_TABLET | ORAL | 0 refills | Status: AC
Start: 2019-07-17 — End: 2019-07-22

## 2019-07-17 MED ORDER — PSEUDOEPH-BROMPHEN-DM 30-2-10 MG/5ML PO SYRP
5.0000 mL | ORAL_SOLUTION | Freq: Four times a day (QID) | ORAL | 0 refills | Status: DC | PRN
Start: 2019-07-17 — End: 2021-10-05

## 2019-07-17 NOTE — ED Provider Notes (Signed)
[facility] Emergency Department Provider Note   ____________________________________________   First MD Initiated Contact with Patient 07/17/19 1103     (approximate)  I have reviewed the triage vital signs and the nursing notes.   HISTORY  Chief Complaint Nasal Congestion and Chest Pain    HPI Aaron Reynolds is a 74 y.o. male patient sent from [facility]  urgent care with complaint of nasal congestion and chest tightness since Saturday night.  Patient reports taking Mucinex and over-the-counter medication without improvement.  Patient denies cough associated with complaint.  Patient state using Vicks VapoRub improves the chest tightness.  Patient states he sleeps on the vent at night and believes might be the catalyst for his complaint.  Denies dyspnea, nausea, vomiting, or vertigo.  Patient state complaint is similar to cold symptoms which he had last year.  Past Medical History:  Diagnosis Date  . Diabetes mellitus without complication (HCC)   . High cholesterol   . Hypertension     Patient Active Problem List   Diagnosis Date Noted  . Chronic venous insufficiency 08/21/2018  . Leg pain 06/06/2018  . Mallet finger 06/06/2018  . Osteoarthritis of knee 10/05/2017  . Background diabetic retinopathy (HCC) 10/06/2010  . Type II diabetes mellitus (HCC) 10/06/2010    Past Surgical History:  Procedure Laterality Date  . ANKLE SURGERY    . ELBOW SURGERY    . HAND SURGERY    . HERNIA REPAIR    . NOSE SURGERY    . TONSILLECTOMY      Prior to Admission medications   Medication Sig Start Date End Date Taking? Authorizing Provider  albuterol (PROVENTIL HFA;VENTOLIN HFA) 108 (90 Base) MCG/ACT inhaler Inhale 2 puffs into the lungs every 6 (six) hours as needed for wheezing or shortness of breath. 05/04/18   Willy Eddy, MD  Alogliptin Benzoate 25 MG TABS  04/07/18   [provider]  amLODipine (NORVASC) 10 MG tablet  04/08/18   [provider]  amLODipine (NORVASC) 5 MG tablet Take by mouth. 08/28/10   [provider]  aspirin EC 325 MG tablet  04/07/18   [provider]  atenolol (TENORMIN) 50 MG tablet  04/07/18   [provider]  azithromycin (ZITHROMAX Z-PAK) 250 MG tablet Take 2 tablets (500 mg) on  Day 1,  followed by 1 tablet (250 mg) once daily on Days 2 through 5. 07/17/19 07/22/19  Joni Reining, PA-C  brompheniramine-pseudoephedrine-DM 30-2-10 MG/5ML syrup Take 5 mLs by mouth 4 (four) times daily as needed. 07/17/19   Joni Reining, PA-C  Calcium Carbonate-Vitamin D 600-400 MG-UNIT tablet  04/07/18   [provider]  chlorthalidone (HYGROTON) 25 MG tablet  02/13/18   [provider]  cyclobenzaprine (FLEXERIL) 10 MG tablet Take by mouth. 03/06/12   [provider]  Diclofenac Sodium (PENNSAID) 2 % SOLN Pennsaid 20 mg/gram/actuation (2 %) topical soln in metered-dose pump  APPLY 2 PUMPS (2 GRAMS) TO AFFECTED AREA TOPICALLY TWICE DAILY AS DIRECTED    [provider]  etodolac (LODINE XL) 400 MG 24 hr tablet Take by mouth. 08/28/10   [provider]  gabapentin (NEURONTIN) 300 MG capsule Take capsule in AM and one capsule QHS 08/23/18   Hyatt, Max T, DPM  gemfibrozil (LOPID) 600 MG tablet Take by mouth. 08/28/10   [provider]  guaifenesin (ROBITUSSIN) 100 MG/5ML syrup Take by mouth. 08/28/10   [provider]  hydrochlorothiazide (HYDRODIURIL) 50 MG tablet Take  by mouth. 08/28/10   [provider]  hydrocortisone 2.5 % cream Frequency:BID   Dosage:0.0     Instructions:  Note:Dose: 2.5 % 08/28/10   [provider]  hydrOXYzine (VISTARIL) 25 MG capsule hydroxyzine pamoate 25 mg capsule    [provider]  Ibuprofen-Famotidine (DUEXIS) 800-26.6 MG TABS Duexis 800 mg-26.6 mg tablet  TAKE ONE TABLET BY MOUTH THREE TIMES DAILY    [provider]  insulin detemir (LEVEMIR) 100 UNIT/ML injection  Frequency:QHS   Dosage:5   UNITS  Instructions:  Note:Dose: 5 UNITS 08/28/10   [provider]  ketoconazole (NIZORAL) 2 % cream Frequency:BID   Dosage:0.0     Instructions:  Note:Dose: 2 % 08/28/10   [provider]  LUBRICATING PLUS EYE DROPS 0.5 % SOLN  04/07/18   [provider]  meloxicam (MOBIC) 15 MG tablet meloxicam 15 mg tablet    [provider]  metFORMIN (GLUCOPHAGE) 1000 MG tablet  04/07/18   [provider]  nabumetone (RELAFEN) 500 MG tablet Take by mouth. 03/06/12   [provider]  naproxen (NAPROSYN) 500 MG tablet Take by mouth. 11/15/08   [provider]  NOVOLOG FLEXPEN 100 UNIT/ML FlexPen  04/07/18   [provider]  orphenadrine (NORFLEX) 100 MG tablet Take by mouth. 10/06/10   [provider]  potassium chloride SA (K-DUR) 20 MEQ tablet  04/07/18   [provider]  pravastatin (PRAVACHOL) 20 MG tablet Take by mouth. 08/28/10   [provider]  pravastatin (PRAVACHOL) 80 MG tablet pravastatin 80 mg tablet    [provider]  prednisoLONE acetate (PRED FORTE) 1 % ophthalmic suspension prednisolone acetate 1 % eye drops,suspension    [provider]  sildenafil (VIAGRA) 50 MG tablet  05/02/18   [provider]  sitaGLIPtin (JANUVIA) 50 MG tablet Januvia 50 mg tablet  TK 1 T PO BID 08/28/10   [provider]  Skin Protectants, Misc. (DERMACERIN) CREA Frequency:BID   Dosage:0.0     Instructions:  Note:Dose: 227 GM 08/28/10   [provider]  terazosin (HYTRIN) 5 MG capsule terazosin 5 mg capsule    [provider]  tobramycin (TOBREX) 0.3 % ophthalmic solution INT 1 GTT IN OU Q 3 H FOR 7 DAYS 04/19/18   [provider]  traZODone (DESYREL) 50 MG tablet trazodone 50 mg tablet 08/28/10   [provider]  triamcinolone cream (KENALOG) 0.1 % Frequency:BID   Dosage:0.0     Instructions:  Note:Dose: 0.1 % 08/28/10   [provider]    Allergies Lisinopril and Celecoxib  No family history on file.  Social History Social History   Tobacco Use  . Smoking status: Never Smoker  . Smokeless tobacco: Never Used  Substance Use Topics  . Alcohol use: Never  . Drug use: Not on file    Review of Systems Constitutional: No fever/chills Eyes: No visual changes. ENT: No sore throat.  Nasal congestion. Cardiovascular: Denies chest pain. Respiratory: Denies shortness of breath.  Chest tightness. Gastrointestinal: No abdominal pain.  No nausea, no vomiting.  No diarrhea.  No constipation. Genitourinary: Negative for dysuria. Musculoskeletal: Negative for back pain. Skin: Negative for rash. Neurological: Negative for headaches, focal weakness or numbness. {**Psychiatric:  Endocrine:  Diabetes, hyperlipidemia, hypertension. Hematological/Lymphatic:  Allergic/Immunilogical: Lisinopril and Celebrex. ____________________________________________   PHYSICAL EXAM:  VITAL SIGNS: ED Triage Vitals  Enc Vitals Group     BP 07/17/19 0949 (!) 117/54     Pulse Rate 07/17/19  0949 63     Resp 07/17/19 0949 18     Temp 07/17/19 0949 98.3 F (36.8 C)     Temp Source 07/17/19 0949 Oral     SpO2 07/17/19 0949 98 %     Weight 07/17/19 0958 165 lb (74.8 kg)     Height 07/17/19 0958 5\' 8"  (1.727 m)     Head Circumference --      Peak Flow --      Pain Score --      Pain Loc --      Pain Edu? --      Excl. in [place]? --     Constitutional: Alert and oriented. Well appearing and in no acute distress. Nose: Edematous nasal turbinates. Mouth/Throat: Mucous membranes are moist.  Oropharynx non-erythematous.  Postnasal drainage. Neck: No stridor.   Hematological/Lymphatic/Immunilogical: No cervical lymphadenopathy. Cardiovascular: Normal rate, regular rhythm. Grossly normal heart sounds.  Good peripheral circulation. Respiratory: Normal respiratory effort.  No retractions. Lungs mild Rales. Gastrointestinal: Soft  and nontender. No distention. No abdominal bruits. No CVA tenderness. Skin:  Skin is warm, dry and intact. No rash noted. Psychiatric: Mood and affect are normal. Speech and behavior are normal.  ____________________________________________   LABS (all labs ordered are listed, but only abnormal results are displayed)  Labs Reviewed  BASIC METABOLIC PANEL - Abnormal; Notable for the following components:      Result Value   Potassium 3.3 (*)    Glucose, Bld 148 (*)    All other components within normal limits  CBC  TROPONIN I (HIGH SENSITIVITY)  TROPONIN I (HIGH SENSITIVITY)   ____________________________________________  EKG   ____________________________________________  RADIOLOGY  ED MD interpretation:    Official radiology report(s): DG Chest 2 View  Result Date: 07/17/2019 CLINICAL DATA:  Chest pain for several days EXAM: CHEST - 2 VIEW COMPARISON:  05/04/2018 FINDINGS: Cardiac shadow is stable. Lungs are well aerated bilaterally with minimal bibasilar atelectasis. No focal infiltrate or effusion is seen. No acute bony abnormality is seen. Degenerative changes of the thoracic spine are noted. IMPRESSION: Mild bibasilar atelectasis. Electronically Signed   By: Inez Catalina M.D.   On: 07/17/2019 10:41    ____________________________________________   PROCEDURES  Procedure(s) performed (including Critical Care):  Procedures   ____________________________________________   INITIAL IMPRESSION / ASSESSMENT AND PLAN / ED COURSE  As part of my medical decision making, I reviewed the following data within the [facility]     Patient presents with 3 days of nasal congestion chest tightness.  Discussed chest x-ray findings with patient.  Patient given discharge care instruction advised take medication as directed.  Patient advised follow-up PCP.    KEVONTE VANECEK was evaluated in Emergency Department on 07/17/2019 for the symptoms described in the  history of present illness. He was evaluated in the context of the global COVID-19 pandemic, which necessitated consideration that the patient might be at risk for infection with the SARS-CoV-2 virus that causes COVID-19. Institutional protocols and algorithms that pertain to the evaluation of patients at risk for COVID-19 are in a state of rapid change based on information released by regulatory bodies including the CDC and federal and state organizations. These policies and algorithms were followed during the patient's care in the ED.       ____________________________________________   FINAL CLINICAL IMPRESSION(S) / ED DIAGNOSES  Final diagnoses:  Sinus congestion  Chest congestion     ED Discharge Orders  Ordered    azithromycin (ZITHROMAX Z-PAK) 250 MG tablet     07/17/19 1122    brompheniramine-pseudoephedrine-DM 30-2-10 MG/5ML syrup  4 times daily PRN     07/17/19 1122           Note:  This document was prepared using Dragon voice recognition software and may include unintentional dictation errors.    Joni Reining, PA-C 07/17/19 1132    Willy Eddy, MD 07/17/19 1258

## 2019-07-17 NOTE — ED Triage Notes (Signed)
Patient presents to the ED with nasal congestion since Saturday and chest tightness since Saturday night.  Patient reports taking mucinex and cold medicine without improvement.  Patient denies cough.  Patient states using vicks vapor rub improves chest tightness.  Patient states the last time he had these symptoms he had to be put on an antibiotic.  Patient states he went to [facility] and was sent to the ED today due to chest tightness.  Patient reports history of diabetes and hypertension.  Patient states, "I slept under a vent and I think that's where the cold symptoms started."  Patient has a pleasant demeanor and is in no obvious distress at this time.  Denies nausea, dizziness and weakness.

## 2019-07-17 NOTE — Discharge Instructions (Signed)
Follow discharge care instruction take medication as directed. °

## 2020-11-20 IMAGING — CR DG CHEST 2V
1 series · 2 of 2 positions shown · non-contrast
Comparison: 05/04/2018

CLINICAL DATA: Chest pain for several days

EXAM:
CHEST - 2 VIEW

[Series 1: dg chest 2 view · 0.14mm/px · 2 of 2 slices shown]
[im 1/2]
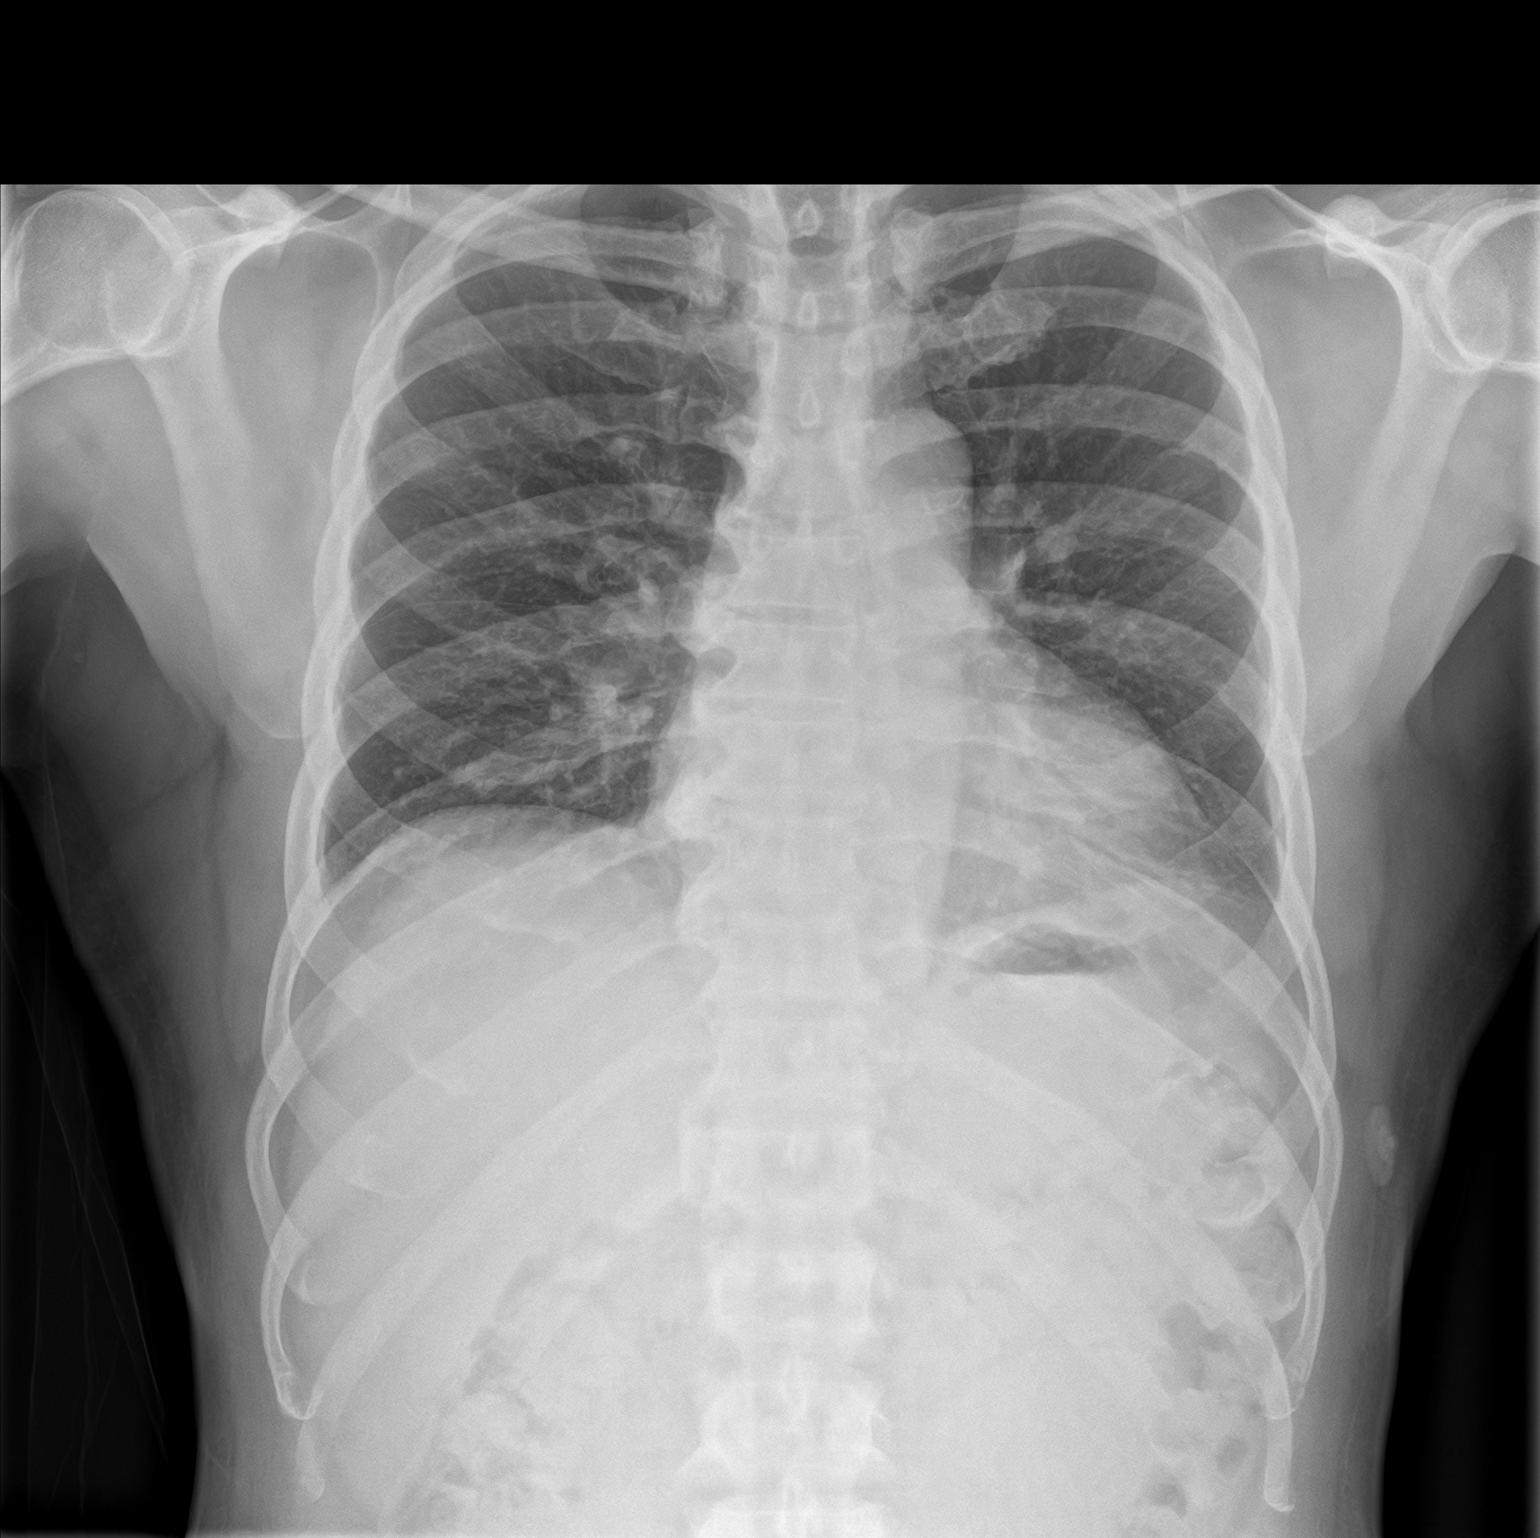
[im 2/2]
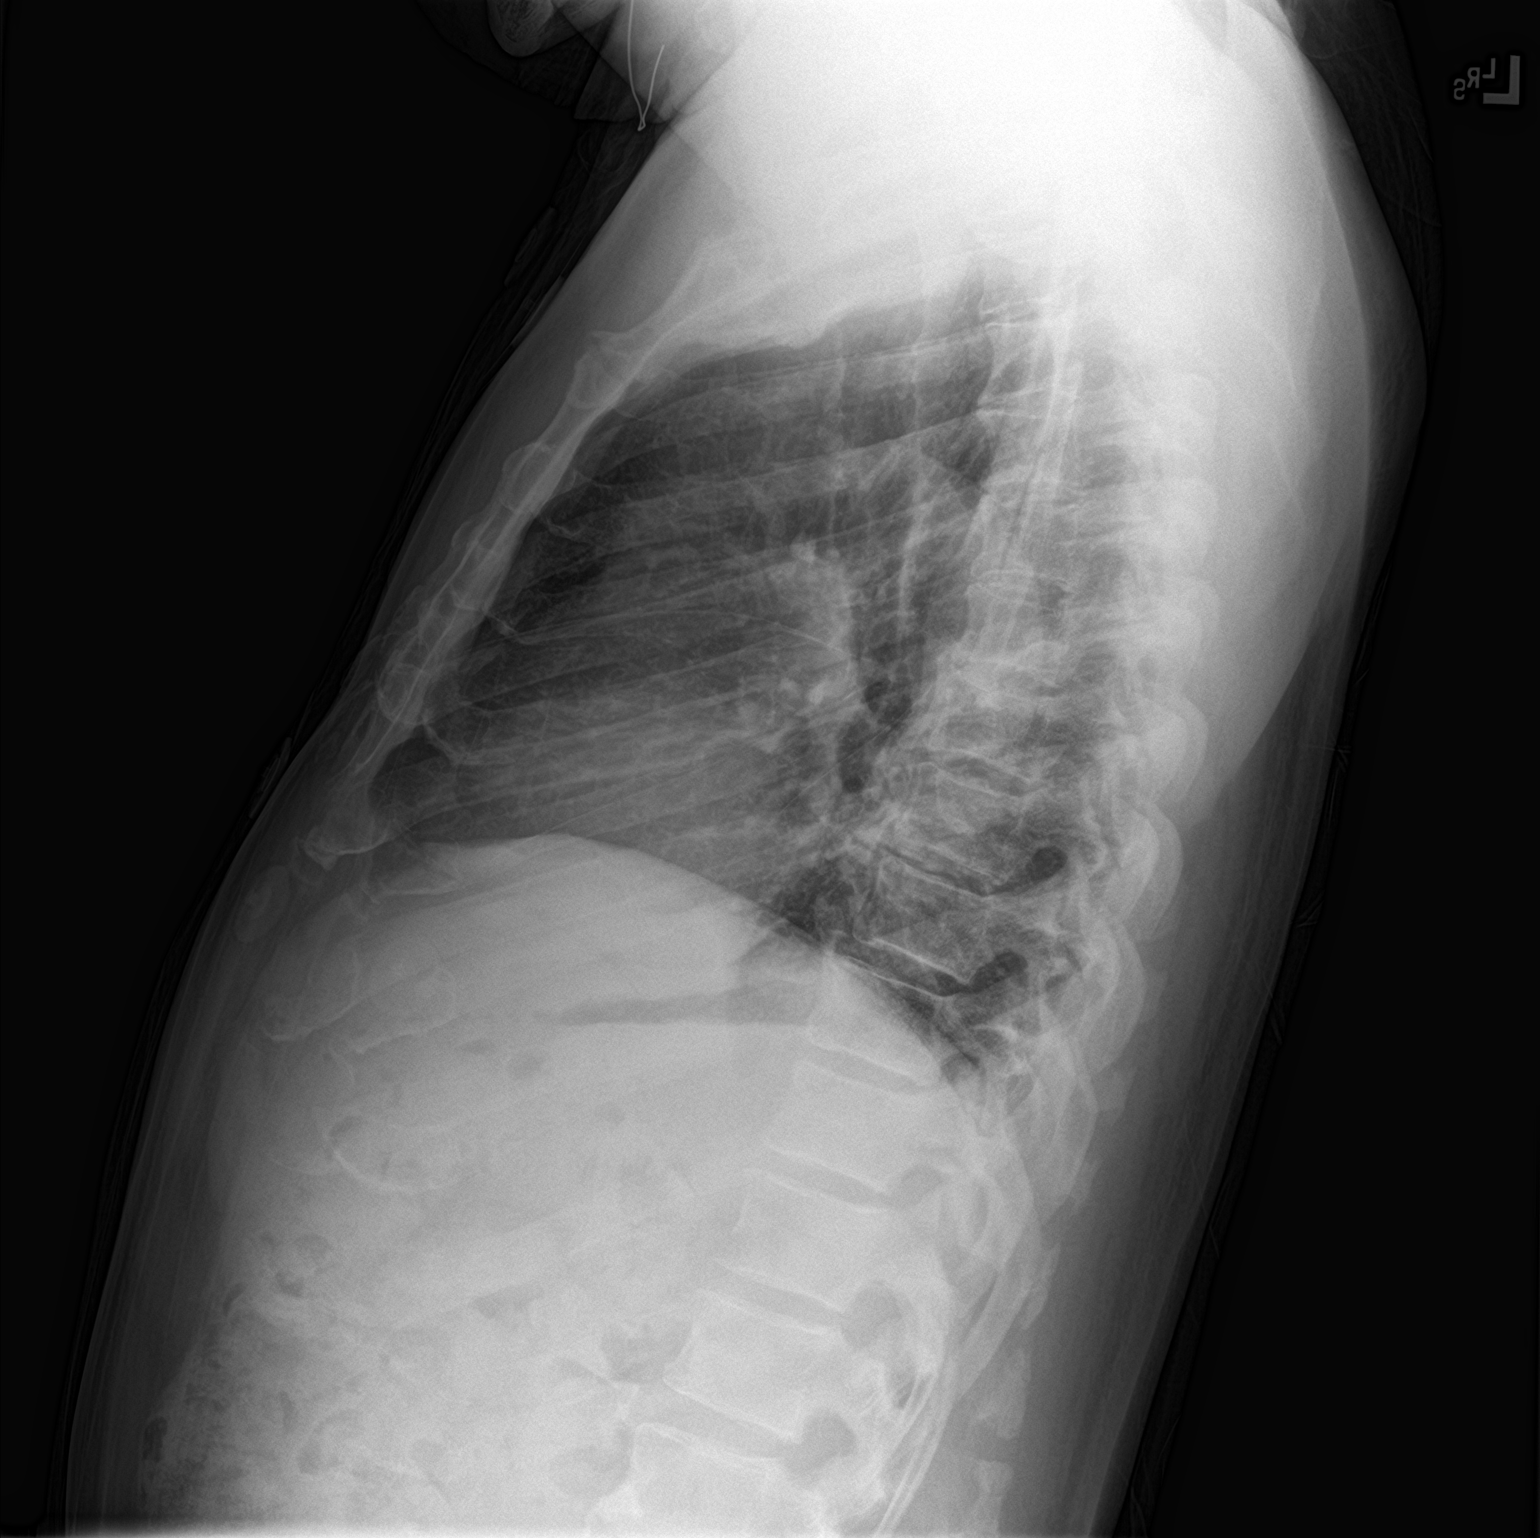

[2 of 2 positions shown; findings below may reference images not displayed]

FINDINGS: Cardiac shadow is stable. Lungs are well aerated bilaterally with
minimal bibasilar atelectasis. No focal infiltrate or effusion is
seen. No acute bony abnormality is seen. Degenerative changes of the
thoracic spine are noted.
IMPRESSION: Mild bibasilar atelectasis.

## 2021-09-10 ENCOUNTER — Ambulatory Visit
Admission: EM | Admit: 2021-09-10 | Discharge: 2021-09-10 | Disposition: A | Discharge status: Home / Self Care | Payer: No Typology Code available for payment source | Attending: Emergency Medicine | Admitting: Emergency Medicine

## 2021-09-10 DIAGNOSIS — Z20822 Contact with and (suspected) exposure to covid-19: Secondary | ICD-10-CM | POA: Diagnosis not present

## 2021-09-10 DIAGNOSIS — R52 Pain, unspecified: Secondary | ICD-10-CM | POA: Insufficient documentation

## 2021-09-10 DIAGNOSIS — E11319 Type 2 diabetes mellitus with unspecified diabetic retinopathy without macular edema: Secondary | ICD-10-CM | POA: Diagnosis not present

## 2021-09-10 DIAGNOSIS — I872 Venous insufficiency (chronic) (peripheral): Secondary | ICD-10-CM | POA: Insufficient documentation

## 2021-09-10 LAB — SARS CORONAVIRUS 2 BY RT PCR: SARS Coronavirus 2 by RT PCR: NEGATIVE

## 2021-09-10 NOTE — ED Provider Notes (Signed)
[facility]    CSN: [id number] Arrival date & time: 09/10/21  1002      History   Chief Complaint Chief Complaint  Patient presents with   Generalized Body Aches    HPI Aaron Reynolds is a 76 y.o. male.   HPI  76 year old male here for evaluation of body aches.  Patient reports that he has been experiencing body aches for the last 2 days.  This is not associated with fever, runny nose or nasal congestion, ear pain, sore throat, cough, shortness of breath, wheezing, or GI complaints.  His wife was recently here and tested positive for COVID.  He has been vaccinated and received 3 boosters.  He does have a significant past medical history to include chronic venous insufficiency, type 2 diabetes, and diabetic retinopathy.  Patient is not in any respiratory distress.  Past Medical History:  Diagnosis Date   Diabetes mellitus without complication (HCC)    High cholesterol    Hypertension     Patient Active Problem List   Diagnosis Date Noted   Chronic venous insufficiency 08/21/2018   Leg pain 06/06/2018   Mallet finger 06/06/2018   Osteoarthritis of knee 10/05/2017   Background diabetic retinopathy (HCC) 10/06/2010   Type II diabetes mellitus (HCC) 10/06/2010    Past Surgical History:  Procedure Laterality Date   ANKLE SURGERY     ELBOW SURGERY     HAND SURGERY     HERNIA REPAIR     NOSE SURGERY     TONSILLECTOMY         Home Medications    Prior to Admission medications   Medication Sig Start Date End Date Taking? Authorizing Provider  albuterol (PROVENTIL HFA;VENTOLIN HFA) 108 (90 Base) MCG/ACT inhaler Inhale 2 puffs into the lungs every 6 (six) hours as needed for wheezing or shortness of breath. 05/04/18   Willy Eddy, MD  Alogliptin Benzoate 25 MG TABS  04/07/18   [provider]  amLODipine (NORVASC) 10 MG tablet  04/08/18   [provider]  amLODipine (NORVASC) 5 MG tablet Take by mouth. 08/28/10   [provider]  aspirin EC 325 MG tablet  04/07/18   [provider]  atenolol (TENORMIN) 50 MG tablet  04/07/18   [provider]  brompheniramine-pseudoephedrine-DM 30-2-10 MG/5ML syrup Take 5 mLs by mouth 4 (four) times daily as needed. 07/17/19   Joni Reining, PA-C  Calcium Carbonate-Vitamin D 600-400 MG-UNIT tablet  04/07/18   [provider]  chlorthalidone (HYGROTON) 25 MG tablet  02/13/18   [provider]  cyclobenzaprine (FLEXERIL) 10 MG tablet Take by mouth. 03/06/12   [provider]  Diclofenac Sodium (PENNSAID) 2 % SOLN Pennsaid 20 mg/gram/actuation (2 %) topical soln in metered-dose pump  APPLY 2 PUMPS (2 GRAMS) TO AFFECTED AREA TOPICALLY TWICE DAILY AS DIRECTED    [provider]  etodolac (LODINE XL) 400 MG 24 hr tablet Take by mouth. 08/28/10   [provider]  gabapentin (NEURONTIN) 300 MG capsule Take capsule in AM and one capsule QHS 08/23/18   Hyatt, Max T, DPM  gemfibrozil (LOPID) 600 MG tablet Take by mouth. 08/28/10   [provider]  guaifenesin (ROBITUSSIN) 100 MG/5ML syrup Take by mouth. 08/28/10   [provider]  hydrochlorothiazide (HYDRODIURIL) 50 MG tablet Take by mouth. 08/28/10   [provider]  hydrocortisone 2.5 % cream Frequency:BID   Dosage:0.0     Instructions:  Note:Dose: 2.5 % 08/28/10  [provider]  hydrOXYzine (VISTARIL) 25 MG capsule hydroxyzine pamoate 25 mg capsule    [provider]  Ibuprofen-Famotidine (DUEXIS) 800-26.6 MG TABS Duexis 800 mg-26.6 mg tablet  TAKE ONE TABLET BY MOUTH THREE TIMES DAILY    [provider]  insulin detemir (LEVEMIR) 100 UNIT/ML injection Frequency:QHS   Dosage:5   UNITS  Instructions:  Note:Dose: 5 UNITS 08/28/10   [provider]  ketoconazole (NIZORAL) 2 % cream Frequency:BID   Dosage:0.0     Instructions:  Note:Dose: 2 % 08/28/10   [provider]  LUBRICATING PLUS EYE DROPS 0.5 % SOLN   04/07/18   [provider]  meloxicam (MOBIC) 15 MG tablet meloxicam 15 mg tablet    [provider]  metFORMIN (GLUCOPHAGE) 1000 MG tablet  04/07/18   [provider]  nabumetone (RELAFEN) 500 MG tablet Take by mouth. 03/06/12   [provider]  naproxen (NAPROSYN) 500 MG tablet Take by mouth. 11/15/08   [provider]  NOVOLOG FLEXPEN 100 UNIT/ML FlexPen  04/07/18   [provider]  orphenadrine (NORFLEX) 100 MG tablet Take by mouth. 10/06/10   [provider]  potassium chloride SA (K-DUR) 20 MEQ tablet  04/07/18   [provider]  pravastatin (PRAVACHOL) 20 MG tablet Take by mouth. 08/28/10   [provider]  pravastatin (PRAVACHOL) 80 MG tablet pravastatin 80 mg tablet    [provider]  prednisoLONE acetate (PRED FORTE) 1 % ophthalmic suspension prednisolone acetate 1 % eye drops,suspension    [provider]  sildenafil (VIAGRA) 50 MG tablet  05/02/18   [provider]  sitaGLIPtin (JANUVIA) 50 MG tablet Januvia 50 mg tablet  TK 1 T PO BID 08/28/10   [provider]  Skin Protectants, Misc. (DERMACERIN) CREA Frequency:BID   Dosage:0.0     Instructions:  Note:Dose: 227 GM 08/28/10   [provider]  terazosin (HYTRIN) 5 MG capsule terazosin 5 mg capsule    [provider]  tobramycin (TOBREX) 0.3 % ophthalmic solution INT 1 GTT IN OU Q 3 H FOR 7 DAYS 04/19/18   [provider]  traZODone (DESYREL) 50 MG tablet trazodone 50 mg tablet 08/28/10   [provider]  triamcinolone cream (KENALOG) 0.1 % Frequency:BID   Dosage:0.0     Instructions:  Note:Dose: 0.1 % 08/28/10   [provider]    Family History History reviewed. No pertinent family history.  Social History Social History   Tobacco Use   Smoking status: Never   Smokeless tobacco: Never  Substance Use Topics   Alcohol use: Never     Allergies   Lisinopril and  Celecoxib   Review of Systems Review of Systems  Constitutional:  Negative for fever.  HENT:  Negative for congestion, ear pain, rhinorrhea and sore throat.   Respiratory:  Negative for cough, shortness of breath and wheezing.   Gastrointestinal:  Negative for diarrhea, nausea and vomiting.  Musculoskeletal:  Positive for arthralgias and myalgias.  Hematological: Negative.   Psychiatric/Behavioral: Negative.       Physical Exam Triage Vital Signs ED Triage Vitals  Enc Vitals Group     BP      Pulse      Resp      Temp      Temp src      SpO2      Weight      Height      Head Circumference      Peak  Flow      Pain Score      Pain Loc      Pain Edu?      Excl. in GC?    No data found.  Updated Vital Signs BP 132/63 (BP Location: Left Arm)   Pulse 69   Temp 98.2 F (36.8 C) (Oral)   Ht 5\' 8"  (1.727 m)   Wt 165 lb (74.8 kg)   SpO2 99%   BMI 25.09 kg/m   Visual Acuity Right Eye Distance:   Left Eye Distance:   Bilateral Distance:    Right Eye Near:   Left Eye Near:    Bilateral Near:     Physical Exam Vitals and nursing note reviewed.  Constitutional:      Appearance: Normal appearance. He is not ill-appearing.  HENT:     Head: Normocephalic and atraumatic.     Right Ear: Tympanic membrane, ear canal and external ear normal. There is no impacted cerumen.     Left Ear: Tympanic membrane, ear canal and external ear normal. There is no impacted cerumen.     Nose: Nose normal. No congestion or rhinorrhea.     Mouth/Throat:     Mouth: Mucous membranes are moist.     Pharynx: Oropharynx is clear. No oropharyngeal exudate or posterior oropharyngeal erythema.  Cardiovascular:     Rate and Rhythm: Normal rate and regular rhythm.     Pulses: Normal pulses.     Heart sounds: Normal heart sounds. No murmur heard.    No friction rub. No gallop.  Pulmonary:     Effort: Pulmonary effort is normal.     Breath sounds: Normal breath sounds. No wheezing, rhonchi or  rales.  Musculoskeletal:     Cervical back: Normal range of motion and neck supple.  Lymphadenopathy:     Cervical: No cervical adenopathy.  Skin:    General: Skin is warm and dry.     Capillary Refill: Capillary refill takes less than 2 seconds.     Findings: No erythema or rash.  Neurological:     General: No focal deficit present.     Mental Status: He is alert and oriented to person, place, and time.  Psychiatric:        Mood and Affect: Mood normal.        Behavior: Behavior normal.        Thought Content: Thought content normal.        Judgment: Judgment normal.      UC Treatments / Results  Labs (all labs ordered are listed, but only abnormal results are displayed) Labs Reviewed  SARS CORONAVIRUS 2 BY RT PCR    EKG   Radiology No results found.  Procedures Procedures (including critical care time)  Medications Ordered in UC Medications - No data to display  Initial Impression / Assessment and Plan / UC Course  I have reviewed the triage vital signs and the nursing notes.  Pertinent labs & imaging results that were available during my care of the patient were reviewed by me and considered in my medical decision making (see chart for details).  Patient is a pleasant, nontoxic-appearing 76 year old male here for evaluation of body aches in the setting of recent COVID exposure.  His wife was recently evaluated at this urgent care and tested positive for COVID.  He has no other associated upper respiratory symptoms, lower respiratory symptoms, or GI symptoms.  He has been vaccinated and thrice boosted.  His physical exam reveals pearly-gray  tympanic membranes bilaterally with normal light reflex and clear external auditory canals.  Nasal mucosa is pink and moist without erythema, edema, or discharge.  Oropharyngeal exam is benign.  No cervical lymphadenopathy appreciable exam.  Cardiopulmonary exam reveals S1-S2 heart sounds with regular rate and rhythm lung sounds that  are clear auscultation all fields.  I will order a COVID PCR.  COVID PCR is negative.  The source of the patient's body aches is unclear.  The rest of the physical exam is unremarkable.  I will have him use over-the-counter Tylenol and ibuprofen according to the package instructions as needed for body aches.  If any new symptoms develop he should return for reevaluation.   Final Clinical Impressions(s) / UC Diagnoses   Final diagnoses:  Body aches     Discharge Instructions      Your COVID test today was negative and your physical exam does not reveal the presence of any infectious process.  Use over-the-counter Tylenol and ibuprofen according to package instructions as needed for pain and body aches.  If you develop any new symptoms, or your body aches worsen, please return for reevaluation.     ED Prescriptions   None    PDMP not reviewed this encounter.   Becky Augusta, NP 09/10/21 1116

## 2021-09-10 NOTE — ED Triage Notes (Signed)
Patient reports that he has had body aches for about 2 days.   Patient reports that he has not had a fever.   Patient reports that his wife was recently here and tested positive for COVID.

## 2021-09-10 NOTE — Discharge Instructions (Addendum)
Your COVID test today was negative and your physical exam does not reveal the presence of any infectious process.  Use over-the-counter Tylenol and ibuprofen according to package instructions as needed for pain and body aches.  If you develop any new symptoms, or your body aches worsen, please return for reevaluation.

## 2021-10-05 ENCOUNTER — Ambulatory Visit
Admission: EM | Admit: 2021-10-05 | Discharge: 2021-10-05 | Disposition: A | Discharge status: Home / Self Care | Payer: No Typology Code available for payment source | Attending: Internal Medicine | Admitting: Internal Medicine

## 2021-10-05 DIAGNOSIS — L03032 Cellulitis of left toe: Secondary | ICD-10-CM | POA: Diagnosis not present

## 2021-10-05 MED ORDER — DOXYCYCLINE HYCLATE 100 MG PO TABS: 100.0000 mg | ORAL_TABLET | Freq: Two times a day (BID) | ORAL | 0 refills | Status: AC

## 2021-10-05 NOTE — Discharge Instructions (Signed)
If the pain gets worse and swelling in 3-4 days, please come back or go see your podiatrist

## 2021-10-05 NOTE — ED Triage Notes (Signed)
Pt c/o 2nd toe swelling and pain x2-3day  Pt went to the podiatrist last week and noticed a piece of skin that was not cut. Pt used some clippers and cut off the skin.

## 2021-10-05 NOTE — ED Provider Notes (Signed)
[facility]    CSN: [id number] Arrival date & time: 10/05/21  1029      History   Chief Complaint No chief complaint on file.   HPI Aaron Reynolds is a 76 y.o. male who presents with L 2nd toe swelling and pain x 2-3 days. Pt cut off skin handing out with clippers before onset of swelling and pain x 2 days. He had gone to his podiatrist last week and noticed there was piece of skin that was not cut off.     Past Medical History:  Diagnosis Date   Diabetes mellitus without complication (HCC)    High cholesterol    Hypertension     Patient Active Problem List   Diagnosis Date Noted   Chronic venous insufficiency 08/21/2018   Leg pain 06/06/2018   Mallet finger 06/06/2018   Osteoarthritis of knee 10/05/2017   Background diabetic retinopathy (HCC) 10/06/2010   Type II diabetes mellitus (HCC) 10/06/2010    Past Surgical History:  Procedure Laterality Date   ANKLE SURGERY     ELBOW SURGERY     HAND SURGERY     HERNIA REPAIR     NOSE SURGERY     TONSILLECTOMY         Home Medications    Prior to Admission medications   Medication Sig Start Date End Date Taking? Authorizing Provider  albuterol (PROVENTIL HFA;VENTOLIN HFA) 108 (90 Base) MCG/ACT inhaler Inhale 2 puffs into the lungs every 6 (six) hours as needed for wheezing or shortness of breath. 05/04/18  Yes Willy Eddy, MD  Alogliptin Benzoate 25 MG TABS  04/07/18  Yes [provider]  amLODipine (NORVASC) 10 MG tablet  04/08/18  Yes [provider]  amLODipine (NORVASC) 5 MG tablet Take by mouth. 08/28/10  Yes [provider]  aspirin EC 325 MG tablet  04/07/18  Yes [provider]  atenolol (TENORMIN) 50 MG tablet  04/07/18  Yes [provider]  Calcium Carbonate-Vitamin D 600-400 MG-UNIT tablet  04/07/18  Yes [provider]  chlorthalidone (HYGROTON) 25 MG tablet  02/13/18  Yes [provider]  cyclobenzaprine (FLEXERIL) 10 MG  tablet Take by mouth. 03/06/12  Yes [provider]  Diclofenac Sodium (PENNSAID) 2 % SOLN Pennsaid 20 mg/gram/actuation (2 %) topical soln in metered-dose pump  APPLY 2 PUMPS (2 GRAMS) TO AFFECTED AREA TOPICALLY TWICE DAILY AS DIRECTED   Yes [provider]  doxycycline (VIBRA-TABS) 100 MG tablet Take 1 tablet (100 mg total) by mouth 2 (two) times daily. 10/05/21  Yes Rodriguez-Southworth, Nettie Elm, PA-C  etodolac (LODINE XL) 400 MG 24 hr tablet Take by mouth. 08/28/10  Yes [provider]  gabapentin (NEURONTIN) 300 MG capsule Take capsule in AM and one capsule QHS 08/23/18  Yes Hyatt, Max T, DPM  gemfibrozil (LOPID) 600 MG tablet Take by mouth. 08/28/10  Yes [provider]  guaifenesin (ROBITUSSIN) 100 MG/5ML syrup Take by mouth. 08/28/10  Yes [provider]  hydrochlorothiazide (HYDRODIURIL) 50 MG tablet Take by mouth. 08/28/10  Yes [provider]  hydrocortisone 2.5 % cream Frequency:BID   Dosage:0.0     Instructions:  Note:Dose: 2.5 % 08/28/10  Yes [provider]  hydrOXYzine (VISTARIL) 25 MG capsule hydroxyzine pamoate 25 mg capsule   Yes [provider]  Ibuprofen-Famotidine (DUEXIS) 800-26.6 MG TABS Duexis 800 mg-26.6 mg tablet  TAKE ONE TABLET BY MOUTH THREE TIMES DAILY   Yes [provider]  insulin detemir (LEVEMIR) 100 UNIT/ML injection  Frequency:QHS   Dosage:5   UNITS  Instructions:  Note:Dose: 5 UNITS 08/28/10  Yes [provider]  ketoconazole (NIZORAL) 2 % cream Frequency:BID   Dosage:0.0     Instructions:  Note:Dose: 2 % 08/28/10  Yes [provider]  LUBRICATING PLUS EYE DROPS 0.5 % SOLN  04/07/18  Yes [provider]  meloxicam (MOBIC) 15 MG tablet meloxicam 15 mg tablet   Yes [provider]  metFORMIN (GLUCOPHAGE) 1000 MG tablet  04/07/18  Yes [provider]  nabumetone (RELAFEN) 500 MG tablet Take by mouth. 03/06/12  Yes [provider]  naproxen  (NAPROSYN) 500 MG tablet Take by mouth. 11/15/08  Yes [provider]  NOVOLOG FLEXPEN 100 UNIT/ML FlexPen  04/07/18  Yes [provider]  orphenadrine (NORFLEX) 100 MG tablet Take by mouth. 10/06/10  Yes [provider]  pravastatin (PRAVACHOL) 20 MG tablet Take by mouth. 08/28/10  Yes [provider]  pravastatin (PRAVACHOL) 80 MG tablet pravastatin 80 mg tablet   Yes [provider]  prednisoLONE acetate (PRED FORTE) 1 % ophthalmic suspension prednisolone acetate 1 % eye drops,suspension   Yes [provider]  sildenafil (VIAGRA) 50 MG tablet  05/02/18  Yes [provider]  sitaGLIPtin (JANUVIA) 50 MG tablet Januvia 50 mg tablet  TK 1 T PO BID 08/28/10  Yes [provider]  Skin Protectants, Misc. (DERMACERIN) CREA Frequency:BID   Dosage:0.0     Instructions:  Note:Dose: 227 GM 08/28/10  Yes [provider]  terazosin (HYTRIN) 5 MG capsule terazosin 5 mg capsule   Yes [provider]  tobramycin (TOBREX) 0.3 % ophthalmic solution INT 1 GTT IN OU Q 3 H FOR 7 DAYS 04/19/18  Yes [provider]  traZODone (DESYREL) 50 MG tablet trazodone 50 mg tablet 08/28/10  Yes [provider]  triamcinolone cream (KENALOG) 0.1 % Frequency:BID   Dosage:0.0     Instructions:  Note:Dose: 0.1 % 08/28/10  Yes [provider]    Family History History reviewed. No pertinent family history.  Social History Social History   Tobacco Use   Smoking status: Never   Smokeless tobacco: Never  Vaping Use   Vaping Use: Never used  Substance Use Topics   Alcohol use: Never   Drug use: Never     Allergies   Lisinopril and Celecoxib   Review of Systems Review of Systems  Constitutional:  Negative for fever.  Musculoskeletal:  Positive for joint swelling.  Skin:  Positive for wound. Negative for color change, pallor and rash.     Physical Exam Triage Vital Signs ED Triage Vitals  Enc Vitals Group      BP 10/05/21 1043 (!) 140/62     Pulse Rate 10/05/21 1043 65     Resp 10/05/21 1043 18     Temp 10/05/21 1043 98.4 F (36.9 C)     Temp Source 10/05/21 1043 Oral     SpO2 10/05/21 1043 100 %     Weight 10/05/21 1041 167 lb (75.8 kg)     Height 10/05/21 1041 5\' 7"  (1.702 m)     Head Circumference --      Peak Flow --      Pain Score 10/05/21 1041 9     Pain Loc --      Pain Edu? --      Excl. in GC? --    No data found.  Updated Vital Signs BP (!) 140/62 (BP Location: Left Arm)   Pulse  65   Temp 98.4 F (36.9 C) (Oral)   Resp 18   Ht 5\' 7"  (1.702 m)   Wt 167 lb (75.8 kg)   SpO2 100%   BMI 26.16 kg/m   Visual Acuity Right Eye Distance:   Left Eye Distance:   Bilateral Distance:    Right Eye Near:   Left Eye Near:    Bilateral Near:     Physical Exam Vitals and nursing note reviewed.  Constitutional:      General: He is not in acute distress.    Appearance: He is not toxic-appearing.  Eyes:     General: No scleral icterus.    Conjunctiva/sclera: Conjunctivae normal.  Pulmonary:     Effort: Pulmonary effort is normal.  Musculoskeletal:        General: Normal range of motion.     Cervical back: Neck supple.  Skin:    General: Skin is warm and dry.     Comments: 2nd L toe corner at the nail shows small pustule with swelling and warmth around it.   Neurological:     Mental Status: He is alert and oriented to person, place, and time.     Gait: Gait normal.  Psychiatric:        Mood and Affect: Mood normal.        Behavior: Behavior normal.        Thought Content: Thought content normal.        Judgment: Judgment normal.      UC Treatments / Results  Labs (all labs ordered are listed, but only abnormal results are displayed) Labs Reviewed - No data to display  EKG   Radiology No results found.  Procedures Procedures (including critical care time)  Medications Ordered in UC Medications - No data to display  Initial Impression / Assessment  and Plan / UC Course  I have reviewed the triage vital signs and the nursing notes. I placed him on Doxy as noted. Advised to soak his foot on Epson salts ffor 20 minutes or a few days. See instructions.      Final Clinical Impressions(s) / UC Diagnoses   Final diagnoses:  Cellulitis of left toe  Paronychia of second toe of left foot     Discharge Instructions      If the pain gets worse and swelling in 3-4 days, please come back or go see your podiatrist      ED Prescriptions     Medication Sig Dispense Auth. Provider   doxycycline (VIBRA-TABS) 100 MG tablet Take 1 tablet (100 mg total) by mouth 2 (two) times daily. 20 tablet Rodriguez-Southworth, , PA-C      PDMP not reviewed this encounter.   Nettie Elm, PA-C 10/05/21 1056

## 2023-02-09 HISTORY — PX: CATARACT EXTRACTION: SUR2

## 2023-06-19 ENCOUNTER — Emergency Department

## 2023-06-19 ENCOUNTER — Other Ambulatory Visit: Payer: Self-pay

## 2023-06-19 ENCOUNTER — Ambulatory Visit
Admission: EM | Admit: 2023-06-19 | Discharge: 2023-06-19 | Disposition: A | Discharge status: Home / Self Care | Attending: Emergency Medicine | Admitting: Emergency Medicine

## 2023-06-19 ENCOUNTER — Emergency Department
Admission: EM | Admit: 2023-06-19 | Discharge: 2023-06-19 | Disposition: A | Discharge status: Home / Self Care | Attending: Emergency Medicine | Admitting: Emergency Medicine

## 2023-06-19 DIAGNOSIS — R1033 Periumbilical pain: Secondary | ICD-10-CM | POA: Diagnosis not present

## 2023-06-19 DIAGNOSIS — I10 Essential (primary) hypertension: Secondary | ICD-10-CM | POA: Insufficient documentation

## 2023-06-19 DIAGNOSIS — E119 Type 2 diabetes mellitus without complications: Secondary | ICD-10-CM | POA: Diagnosis not present

## 2023-06-19 DIAGNOSIS — R109 Unspecified abdominal pain: Secondary | ICD-10-CM | POA: Insufficient documentation

## 2023-06-19 LAB — CBC
HCT: 49.6 % (ref 39.0–52.0)
Hemoglobin: 16.2 g/dL (ref 13.0–17.0)
MCH: 29.9 pg (ref 26.0–34.0)
MCHC: 32.7 g/dL (ref 30.0–36.0)
MCV: 91.5 fL (ref 80.0–100.0)
Platelets: 225 10*3/uL (ref 150–400)
RBC: 5.42 MIL/uL (ref 4.22–5.81)
RDW: 12.9 % (ref 11.5–15.5)
WBC: 7 10*3/uL (ref 4.0–10.5)
nRBC: 0 % (ref 0.0–0.2)

## 2023-06-19 LAB — COMPREHENSIVE METABOLIC PANEL WITH GFR
ALT: 24 U/L (ref 0–44)
AST: 21 U/L (ref 15–41)
Albumin: 3.9 g/dL (ref 3.5–5.0)
Alkaline Phosphatase: 95 U/L (ref 38–126)
Anion gap: 10 (ref 5–15)
BUN: 11 mg/dL (ref 8–23)
CO2: 25 mmol/L (ref 22–32)
Calcium: 9.3 mg/dL (ref 8.9–10.3)
Chloride: 103 mmol/L (ref 98–111)
Creatinine, Ser: 0.87 mg/dL (ref 0.61–1.24)
GFR, Estimated: >60 mL/min (ref >60)
Glucose, Bld: 101 mg/dL — ABNORMAL HIGH (ref 70–99)
Potassium: 3.8 mmol/L (ref 3.5–5.1)
Sodium: 138 mmol/L (ref 135–145)
Total Bilirubin: 0.8 mg/dL (ref 0.0–1.2)
Total Protein: 6.5 g/dL (ref 6.5–8.1)

## 2023-06-19 LAB — LIPASE, BLOOD: Lipase: 34 U/L (ref 11–51)

## 2023-06-19 LAB — URINALYSIS, ROUTINE W REFLEX MICROSCOPIC
Bacteria, UA: NONE SEEN
Bilirubin Urine: NEGATIVE
Glucose, UA: >=500 mg/dL — AB
Hgb urine dipstick: NEGATIVE
Ketones, ur: NEGATIVE mg/dL
Leukocytes,Ua: NEGATIVE
Nitrite: NEGATIVE
Protein, ur: NEGATIVE mg/dL
Specific Gravity, Urine: 1.015 (ref 1.005–1.030)
Squamous Epithelial / HPF: 0 /HPF (ref 0–5)
pH: 8 (ref 5.0–8.0)

## 2023-06-19 MED ORDER — PANTOPRAZOLE SODIUM 40 MG PO TBEC: 40.0000 mg | DELAYED_RELEASE_TABLET | Freq: Every day | ORAL | 1 refills | Status: AC

## 2023-06-19 MED ORDER — IOHEXOL 300 MG/ML  SOLN
100.0000 mL | Freq: Once | INTRAMUSCULAR | Status: AC | PRN
  Administered 2023-06-19: 80 mL via INTRAVENOUS

## 2023-06-19 MED ORDER — SUCRALFATE 1 G PO TABS: 1.0000 g | ORAL_TABLET | Freq: Four times a day (QID) | ORAL | 0 refills | Status: AC

## 2023-06-19 NOTE — ED Provider Notes (Signed)
 HPI  SUBJECTIVE:  Aaron Reynolds is a 78 y.o. male who presents with 2 weeks of dull, constant periumbilical pain that became much worse this morning.  He states that the pain extends into the supra and infraumbilical region.  It does not migrate, radiate through to the back.  He denies a ripping or tearing sensation.  No nausea, vomiting, although he reports anorexia.  No recent change in his Ozempic dose.  No abdominal distention, syncope, palpable pulsatile masses, fevers, urinary or GU complaints.  He had a normal bowel movement this morning with no change in his pain.  Denies melena, hematochezia.  No EtOH, NSAID use.  No antipyretic in the past 6 hours.  He tried MiraLAX and Pepto-Bismol without improvement in his symptoms.  No aggravating factors.  It is not associated with p.o. intake, movement, urination or defecation.  The car ride over here was not painful. He has a history of hypertension, diabetes and hypercholesterolemia, status post hernia repair.  No history of AAA, GI bleed, pancreatitis, gallbladder disease, other abdominal surgeries, SBO, diverticulitis.  He had colonoscopy 3 to 4 years ago which showed polyps.  His glucose has been running within normal limits.  His glucose here is 116 per patient's CGM.  [facility].  Past Medical History:  Diagnosis Date   Diabetes mellitus without complication (HCC)    High cholesterol    Hypertension     Past Surgical History:  Procedure Laterality Date   ANKLE SURGERY     CATARACT EXTRACTION Right 02/09/2023   ELBOW SURGERY     HAND SURGERY     HERNIA REPAIR     NOSE SURGERY     TONSILLECTOMY      History reviewed. No pertinent family history.  Social History   Tobacco Use   Smoking status: Never   Smokeless tobacco: Never  Vaping Use   Vaping status: Never Used  Substance Use Topics   Alcohol use: Never   Drug use: Never    No current facility-administered medications for this encounter.  Current  Outpatient Medications:    Alogliptin Benzoate 25 MG TABS, , Disp: , Rfl:    amLODipine (NORVASC) 10 MG tablet, , Disp: , Rfl:    aspirin EC 325 MG tablet, , Disp: , Rfl:    atenolol (TENORMIN) 50 MG tablet, , Disp: , Rfl:    Calcium Carbonate-Vitamin D 600-400 MG-UNIT tablet, , Disp: , Rfl:    Diclofenac Sodium (PENNSAID) 2 % SOLN, Pennsaid 20 mg/gram/actuation (2 %) topical soln in metered-dose pump  APPLY 2 PUMPS (2 GRAMS) TO AFFECTED AREA TOPICALLY TWICE DAILY AS DIRECTED, Disp: , Rfl:    etodolac (LODINE XL) 400 MG 24 hr tablet, Take by mouth., Disp: , Rfl:    gabapentin  (NEURONTIN ) 300 MG capsule, Take capsule in AM and one capsule QHS, Disp: 60 capsule, Rfl: 3   gemfibrozil (LOPID) 600 MG tablet, Take by mouth., Disp: , Rfl:    hydrocortisone 2.5 % cream, Frequency:BID   Dosage:0.0     Instructions:  Note:Dose: 2.5 %, Disp: , Rfl:    Ibuprofen-Famotidine (DUEXIS) 800-26.6 MG TABS, Duexis 800 mg-26.6 mg tablet  TAKE ONE TABLET BY MOUTH THREE TIMES DAILY, Disp: , Rfl:    insulin detemir (LEVEMIR) 100 UNIT/ML injection, Frequency:QHS   Dosage:5   UNITS  Instructions:  Note:Dose: 5 UNITS, Disp: , Rfl:    ketoconazole (NIZORAL) 2 % cream, Frequency:BID   Dosage:0.0     Instructions:  Note:Dose: 2 %, Disp: ,  Rfl:    meloxicam (MOBIC) 15 MG tablet, meloxicam 15 mg tablet, Disp: , Rfl:    metFORMIN (GLUCOPHAGE) 1000 MG tablet, , Disp: , Rfl:    nabumetone (RELAFEN) 500 MG tablet, Take by mouth., Disp: , Rfl:    NOVOLOG FLEXPEN 100 UNIT/ML FlexPen, , Disp: , Rfl:    pravastatin (PRAVACHOL) 20 MG tablet, Take by mouth., Disp: , Rfl:    pravastatin (PRAVACHOL) 80 MG tablet, pravastatin 80 mg tablet, Disp: , Rfl:    sitaGLIPtin (JANUVIA) 50 MG tablet, Januvia 50 mg tablet  TK 1 T PO BID, Disp: , Rfl:    Skin Protectants, Misc. (DERMACERIN) CREA, Frequency:BID   Dosage:0.0     Instructions:  Note:Dose: 227 GM, Disp: , Rfl:    terazosin (HYTRIN) 5 MG capsule, terazosin 5 mg capsule, Disp: , Rfl:     albuterol  (PROVENTIL  HFA;VENTOLIN  HFA) 108 (90 Base) MCG/ACT inhaler, Inhale 2 puffs into the lungs every 6 (six) hours as needed for wheezing or shortness of breath., Disp: 1 Inhaler, Rfl: 2   amLODipine (NORVASC) 5 MG tablet, Take by mouth., Disp: , Rfl:    chlorthalidone (HYGROTON) 25 MG tablet, , Disp: , Rfl:    cyclobenzaprine (FLEXERIL) 10 MG tablet, Take by mouth., Disp: , Rfl:    doxycycline  (VIBRA -TABS) 100 MG tablet, Take 1 tablet (100 mg total) by mouth 2 (two) times daily., Disp: 20 tablet, Rfl: 0   guaifenesin (ROBITUSSIN) 100 MG/5ML syrup, Take by mouth., Disp: , Rfl:    hydrochlorothiazide (HYDRODIURIL) 50 MG tablet, Take by mouth., Disp: , Rfl:    hydrOXYzine (VISTARIL) 25 MG capsule, hydroxyzine pamoate 25 mg capsule, Disp: , Rfl:    LUBRICATING PLUS EYE DROPS 0.5 % SOLN, , Disp: , Rfl:    naproxen (NAPROSYN) 500 MG tablet, Take by mouth., Disp: , Rfl:    orphenadrine (NORFLEX) 100 MG tablet, Take by mouth., Disp: , Rfl:    prednisoLONE acetate (PRED FORTE) 1 % ophthalmic suspension, prednisolone acetate 1 % eye drops,suspension, Disp: , Rfl:    sildenafil (VIAGRA) 50 MG tablet, , Disp: , Rfl:    tobramycin (TOBREX) 0.3 % ophthalmic solution, INT 1 GTT IN OU Q 3 H FOR 7 DAYS, Disp: , Rfl:    traZODone (DESYREL) 50 MG tablet, trazodone 50 mg tablet, Disp: , Rfl:    triamcinolone cream (KENALOG) 0.1 %, Frequency:BID   Dosage:0.0     Instructions:  Note:Dose: 0.1 %, Disp: , Rfl:   Allergies  Allergen Reactions   Lisinopril Anaphylaxis   Celecoxib Other (See Comments)    Causes pt's blood sugar to rise Causes pt's blood sugar to rise      ROS  As noted in HPI.   Physical Exam  BP (!) 147/65 (BP Location: Left Arm)   Pulse 66   Temp 98 F (36.7 C) (Oral)   Ht 5\' 8"  (1.727 m)   Wt 69.5 kg   SpO2 96%   BMI 23.29 kg/m   Constitutional: Well developed, well nourished, no acute distress.  Moving around comfortably. Eyes: PERRL, EOMI, conjunctiva normal  bilaterally HENT: Normocephalic, atraumatic,mucus membranes moist Respiratory: Clear to auscultation bilaterally, no rales, no wheezing, no rhonchi Cardiovascular: Normal rate and rhythm, no murmurs, no gallops, no rubs GI: Normal appearance, soft, nondistended, normal bowel sounds, tenderness around the periumbilical and infraumbilical region, no rebound, no guarding.  No pulsatile palpable abdominal masses.  No suprapubic, flank tenderness.  Negative Murphy, negative McBurney. Back: Very mild left CVAT skin: No rash,  skin intact Musculoskeletal: No edema, no tenderness, no deformities Neurologic: Alert & oriented x 3, CN III-XII grossly intact, no motor deficits, sensation grossly intact Psychiatric: Speech and behavior appropriate   ED Course   Medications - No data to display  No orders of the defined types were placed in this encounter.  No results found for this or any previous visit (from the past 24 hours). No results found.  ED Clinical Impression  1. Periumbilical abdominal pain      ED Assessment/Plan      Patient presents with 2 weeks of periumbilical pain that has gotten much worse today.  He does not have any peritoneal signs.  Differential includes cholecystitis, pancreatitis, enteritis, colitis, aortic abdominal aneurysm, nephrolithiasis.  Lower in the differential is SBO, mesenteric ischemia, appendicitis, hepatitis, perforation.  We do not have any imaging other than plain films available here today in the urgent care, I believe that the patient needs a more comprehensive workup than what can be done here to rule out the above emergencies.  Advised patient to be n.p.o. until ER evaluation is complete.  He is stable to go by private vehicle.  Discussed rationale for transfer to the emergency department with patient.  He agrees to go.  Discussed patient with Odilia Bennett, charge nurse at the ED.   No orders of the defined types were placed in this  encounter.     *This clinic note was created using Dragon dictation software. Therefore, there may be occasional mistakes despite careful proofreading. ?    Ethlyn Herd, MD 06/19/23 1206

## 2023-06-19 NOTE — ED Provider Notes (Signed)
 [facility] Provider Note    Event Date/Time   First MD Initiated Contact with Patient 06/19/23 1235     (approximate)  History   Chief Complaint: Abdominal Pain  HPI  DELSHON KANHAI is a 78 y.o. male with a past medical history of diabetes, hypertension, hyperlipidemia presents to the emergency department for abdominal pain.  According to the patient for the past 2 weeks he has been experiencing a fairly intermittent but significant pain in his mid abdomen, periumbilical area.  Patient denies any fever, vomiting, diarrhea, urinary symptoms.  Patient does state a history of a prior inguinal surgery many years ago.  Physical Exam   Triage Vital Signs: ED Triage Vitals  Encounter Vitals Group     BP 06/19/23 1229 (!) 169/75     Systolic BP Percentile --      Diastolic BP Percentile --      Pulse Rate 06/19/23 1229 67     Resp 06/19/23 1229 17     Temp 06/19/23 1229 97.7 F (36.5 C)     Temp Source 06/19/23 1229 Oral     SpO2 06/19/23 1229 99 %     Weight 06/19/23 1230 153 lb 3.5 oz (69.5 kg)     Height 06/19/23 1230 5\' 8"  (1.727 m)     Head Circumference --      Peak Flow --      Pain Score 06/19/23 1230 10     Pain Loc --      Pain Education --      Exclude from Growth Chart --     Most recent vital signs: Vitals:   06/19/23 1229  BP: (!) 169/75  Pulse: 67  Resp: 17  Temp: 97.7 F (36.5 C)  SpO2: 99%    General: Awake, no distress.  CV:  Good peripheral perfusion.  Regular rate and rhythm  Resp:  Normal effort.  Equal breath sounds bilaterally.  Abd:  No distention.  Soft, moderate tenderness to palpation in the periumbilical to epigastric region.  No rebound or guarding.  ED Results / Procedures / Treatments    RADIOLOGY  CT read pending.  I have reviewed the images.  On my evaluation patient appears to have a thickened stomach lining as well as proximal small intestine.  MEDICATIONS ORDERED IN ED: Medications - No data  to display   IMPRESSION / MDM / ASSESSMENT AND PLAN / ED COURSE  I reviewed the triage vital signs and the nursing notes.  Patient's presentation is most consistent with acute presentation with potential threat to life or bodily function.  Patient presents to the emergency department for mid abdominal pain intermittent over the last 2 weeks.  Patient states moderate currently.  Patient also states he is hungry and asking for something to eat and drink, we will hold off until the CT has been performed.  Given the patient's moderate tenderness to palpation in this area in 2 weeks of duration we will obtain labs, urinalysis and a CT scan with contrast of the abdomen/pelvis.  Differential is quite broad but would include pancreatitis, ventral hernia or peri-umbilical hernia (although none palpated on exam), UTI or pyelonephritis, colitis or diverticulitis among other intra-abdominal pathology.  Lab work shows reassuring CBC chemistry and lipase.  Urinalysis is normal as well.  Awaiting CT read.  On my evaluation of the images patient appears to have a thickened stomach wall as well as proximal small intestine.  We will plan to place the  patient on Protonix and sucralfate for possible gastritis however awaiting CT read to evaluate for any oncologic process or anything else of concern.  Patient care signed out to oncoming provider.  FINAL CLINICAL IMPRESSION(S) / ED DIAGNOSES   Abdominal pain   Note:  This document was prepared using Dragon voice recognition software and may include unintentional dictation errors.   Ruth Cove, MD 06/19/23 1510

## 2023-06-19 NOTE — ED Notes (Signed)
Pt encouraged to provide urine sample per order.

## 2023-06-19 NOTE — ED Notes (Signed)
 Pt d/c home per EDP order. Discharge summary reviewed with pt , pt verbalizes understanding. NAD. Ambulatory off unit.

## 2023-06-19 NOTE — ED Notes (Signed)
 Pt to CT

## 2023-06-19 NOTE — ED Triage Notes (Signed)
 Pt c/o abdominal pain x2weeks  Pt states that the pain is along the mid stomach behind the navel.  Pt states that he has normal bowel movements with no diarrhea  Pt denies any new foods, injury, or physical activities  Pt is on ozempic  Pt would like a referral and declines going to the [place]

## 2023-06-19 NOTE — Discharge Instructions (Signed)
 Please go immediately to the [facility] emergency department.  Do not have anything to eat or drink until your ER evaluation is complete.  The differential is vast including aortic abdominal aneurysm, pancreatitis, enteritis, colitis, nephrolithiasis, gallbladder disease/cholecystitis.  Leave you need more comprehensive workup than what can be done here at the urgent care.  Let them know if your pain changes or gets worse.  I will call them and let them know that you are on your way.

## 2023-06-19 NOTE — ED Provider Notes (Signed)
-----------------------------------------   3:28 PM on 06/19/2023 -----------------------------------------   Blood pressure (!) 147/69, pulse 66, temperature 97.7 F (36.5 C), temperature source Oral, resp. rate 16, height 5\' 8"  (1.727 m), weight 69.5 kg, SpO2 96%.  Received signout on patient.  78 year old male presenting today for intermittent abdominal pain mostly in the periumbilical area.  No other associated symptoms.  Laboratory workup was reassuring and patient was signed out pending results of CT.  CT shows evidence of gastritis but no other acute intra-abdominal pathology.  Patient otherwise stable and tolerating p.o.  He was discharged with Carafate and Protonix per initial provider.  Follow-up with GI.   Kandee Orion, MD 06/19/23 [phone]

## 2023-06-19 NOTE — Discharge Instructions (Addendum)
 Please call the number provided for GI medicine.  Please take your medications as prescribed for the entire course.  Return to the emergency department for any worsening pain or any other symptom personally concerning to yourself.  Avoid spicy foods and NSAIDs such as ibuprofen/Motrin/Aleve, abstain from alcohol.

## 2023-06-19 NOTE — ED Notes (Signed)
 Patient is being discharged from the Urgent Care and sent to the Emergency Department via Personal Vehicle . Per Dr. Mauricia South, patient is in need of higher level of care due to 10/10 Abdominal Pain. Patient is aware and verbalizes understanding of plan of care.  Vitals:   06/19/23 1053  BP: (!) 147/65  Pulse: 66  Temp: 98 F (36.7 C)  SpO2: 96%

## 2023-06-19 NOTE — ED Triage Notes (Signed)
 Pt c/o periumbilical pain x2 weeks. Pt denies n/v/d. Pt reports pain has progressively worsened. Pt denies exacerbating or alleviating factors. Pt reports inguinal hernia repair when he was 20, otherwise, no other surgery.
